# Patient Record
Sex: Female | Born: 1965 | Race: White | Hispanic: No | Smoking: Former smoker
Health system: Southern US, Community
[De-identification: ages and names within clinical notes are randomized; demographics above are authoritative.]

## PROBLEM LIST (undated history)

## (undated) DIAGNOSIS — T7840XA Allergy, unspecified, initial encounter: Secondary | ICD-10-CM

## (undated) DIAGNOSIS — C801 Malignant (primary) neoplasm, unspecified: Secondary | ICD-10-CM

## (undated) DIAGNOSIS — R7303 Prediabetes: Secondary | ICD-10-CM

## (undated) DIAGNOSIS — K4090 Unilateral inguinal hernia, without obstruction or gangrene, not specified as recurrent: Secondary | ICD-10-CM

## (undated) DIAGNOSIS — I10 Essential (primary) hypertension: Secondary | ICD-10-CM

## (undated) DIAGNOSIS — K219 Gastro-esophageal reflux disease without esophagitis: Secondary | ICD-10-CM

## (undated) DIAGNOSIS — D649 Anemia, unspecified: Secondary | ICD-10-CM

## (undated) DIAGNOSIS — N200 Calculus of kidney: Secondary | ICD-10-CM

## (undated) HISTORY — PX: COLONOSCOPY: SHX174

## (undated) HISTORY — DX: Allergy, unspecified, initial encounter: T78.40XA

## (undated) HISTORY — DX: Anemia, unspecified: D64.9

## (undated) HISTORY — PX: INGUINAL HERNIA REPAIR: SUR1180

## (undated) HISTORY — PX: APPENDECTOMY: SHX54

## (undated) HISTORY — PX: WISDOM TOOTH EXTRACTION: SHX21

## (undated) HISTORY — DX: Essential (primary) hypertension: I10

## (undated) HISTORY — DX: Prediabetes: R73.03

## (undated) HISTORY — PX: ABDOMINAL HYSTERECTOMY: SHX81

## (undated) HISTORY — DX: Unilateral inguinal hernia, without obstruction or gangrene, not specified as recurrent: K40.90

## (undated) HISTORY — DX: Calculus of kidney: N20.0

## (undated) HISTORY — PX: COLON SURGERY: SHX602

## (undated) HISTORY — PX: LITHOTRIPSY: SUR834

## (undated) HISTORY — PX: UPPER GASTROINTESTINAL ENDOSCOPY: SHX188

## (undated) HISTORY — PX: CYSTOSCOPY WITH RETROGRADE PYELOGRAM, URETEROSCOPY AND STENT PLACEMENT: SHX5789

---

## 2005-01-08 HISTORY — PX: PARTIAL HYSTERECTOMY: SHX80

## 2009-01-08 DIAGNOSIS — N200 Calculus of kidney: Secondary | ICD-10-CM

## 2009-01-08 HISTORY — DX: Calculus of kidney: N20.0

## 2011-12-31 ENCOUNTER — Emergency Department (HOSPITAL_COMMUNITY)
Admission: EM | Admit: 2011-12-31 | Discharge: 2012-01-01 | Disposition: A | Discharge status: Home / Self Care | Payer: Self-pay | Attending: Emergency Medicine | Admitting: Emergency Medicine

## 2011-12-31 ENCOUNTER — Emergency Department (INDEPENDENT_AMBULATORY_CARE_PROVIDER_SITE_OTHER)
Admission: EM | Admit: 2011-12-31 | Discharge: 2011-12-31 | Disposition: A | Discharge status: Nursing Home (Includes ICF) | Payer: Self-pay | Source: Home / Self Care | Attending: Emergency Medicine | Admitting: Emergency Medicine

## 2011-12-31 ENCOUNTER — Encounter (HOSPITAL_COMMUNITY): Payer: Self-pay | Admitting: Emergency Medicine

## 2011-12-31 ENCOUNTER — Emergency Department (HOSPITAL_COMMUNITY): Payer: Self-pay

## 2011-12-31 DIAGNOSIS — Z87891 Personal history of nicotine dependence: Secondary | ICD-10-CM | POA: Insufficient documentation

## 2011-12-31 DIAGNOSIS — K5792 Diverticulitis of intestine, part unspecified, without perforation or abscess without bleeding: Secondary | ICD-10-CM

## 2011-12-31 DIAGNOSIS — R112 Nausea with vomiting, unspecified: Secondary | ICD-10-CM | POA: Insufficient documentation

## 2011-12-31 DIAGNOSIS — R42 Dizziness and giddiness: Secondary | ICD-10-CM | POA: Insufficient documentation

## 2011-12-31 DIAGNOSIS — Z87442 Personal history of urinary calculi: Secondary | ICD-10-CM | POA: Insufficient documentation

## 2011-12-31 DIAGNOSIS — Z90710 Acquired absence of both cervix and uterus: Secondary | ICD-10-CM | POA: Insufficient documentation

## 2011-12-31 DIAGNOSIS — N390 Urinary tract infection, site not specified: Secondary | ICD-10-CM | POA: Insufficient documentation

## 2011-12-31 DIAGNOSIS — R1900 Intra-abdominal and pelvic swelling, mass and lump, unspecified site: Secondary | ICD-10-CM | POA: Insufficient documentation

## 2011-12-31 DIAGNOSIS — K5732 Diverticulitis of large intestine without perforation or abscess without bleeding: Secondary | ICD-10-CM

## 2011-12-31 DIAGNOSIS — R51 Headache: Secondary | ICD-10-CM | POA: Insufficient documentation

## 2011-12-31 LAB — CBC WITH DIFFERENTIAL/PLATELET
Basophils Absolute: 0 10*3/uL (ref 0.0–0.1)
Basophils Relative: 0 % (ref 0–1)
Eosinophils Absolute: 0.1 10*3/uL (ref 0.0–0.7)
Eosinophils Relative: 1 % (ref 0–5)
HCT: 33.6 % — ABNORMAL LOW (ref 36.0–46.0)
Hemoglobin: 10.4 g/dL — ABNORMAL LOW (ref 12.0–15.0)
Lymphocytes Relative: 20 % (ref 12–46)
Lymphs Abs: 2.9 10*3/uL (ref 0.7–4.0)
MCH: 21.1 pg — ABNORMAL LOW (ref 26.0–34.0)
MCHC: 31 g/dL (ref 30.0–36.0)
MCV: 68.2 fL — ABNORMAL LOW (ref 78.0–100.0)
Monocytes Absolute: 1.6 10*3/uL — ABNORMAL HIGH (ref 0.1–1.0)
Monocytes Relative: 11 % (ref 3–12)
Neutro Abs: 9.8 10*3/uL — ABNORMAL HIGH (ref 1.7–7.7)
Neutrophils Relative %: 68 % (ref 43–77)
Platelets: 343 10*3/uL (ref 150–400)
RBC: 4.93 MIL/uL (ref 3.87–5.11)
RDW: 19 % — ABNORMAL HIGH (ref 11.5–15.5)
WBC: 14.4 10*3/uL — ABNORMAL HIGH (ref 4.0–10.5)

## 2011-12-31 LAB — POCT URINALYSIS DIP (DEVICE)
Bilirubin Urine: NEGATIVE
Glucose, UA: NEGATIVE mg/dL
Ketones, ur: 15 mg/dL — AB
Nitrite: POSITIVE — AB
Protein, ur: 30 mg/dL — AB
Specific Gravity, Urine: 1.02 (ref 1.005–1.030)
Urobilinogen, UA: 1 mg/dL (ref 0.0–1.0)
pH: 5.5 (ref 5.0–8.0)

## 2011-12-31 LAB — BASIC METABOLIC PANEL
BUN: 9 mg/dL (ref 6–23)
CO2: 21 mEq/L (ref 19–32)
Calcium: 9.6 mg/dL (ref 8.4–10.5)
Chloride: 100 mEq/L (ref 96–112)
Creatinine, Ser: 0.82 mg/dL (ref 0.50–1.10)
GFR calc Af Amer: >90 mL/min (ref >90)
GFR calc non Af Amer: 83 mL/min — ABNORMAL LOW (ref >90)
Glucose, Bld: 107 mg/dL — ABNORMAL HIGH (ref 70–99)
Potassium: 3.5 mEq/L (ref 3.5–5.1)
Sodium: 136 mEq/L (ref 135–145)

## 2011-12-31 LAB — LIPASE, BLOOD: Lipase: 20 U/L (ref 11–59)

## 2011-12-31 IMAGING — CT CT ABD-PELV W/O CM
3 of 4 series · 14 of 32 positions shown, 17 images · non-contrast
Comparison: None.

CLINICAL DATA: Left flank pain, history of stones

CT ABDOMEN AND PELVIS WITHOUT CONTRAST
TECHNIQUE: Multidetector CT imaging of the abdomen and pelvis was
performed following the standard protocol without intravenous
contrast.

[Series 2: pre contrast · axial · non-contrast · 0.85mm/px · z∈[-313,-148]mm · 2 of 101 slices shown, 5 images (1 of 2)]
[im 34/101  soft-tissue]
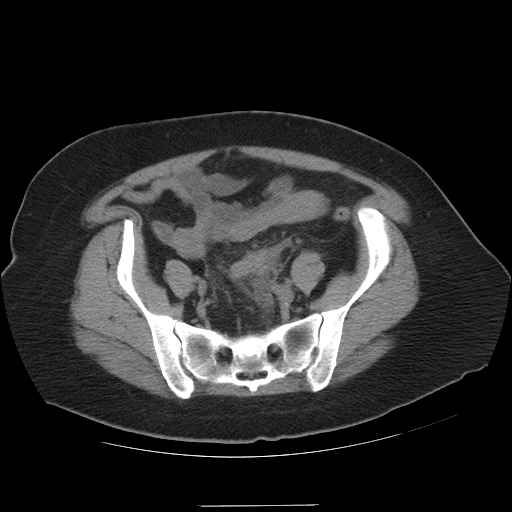
[im 34/101  lung]
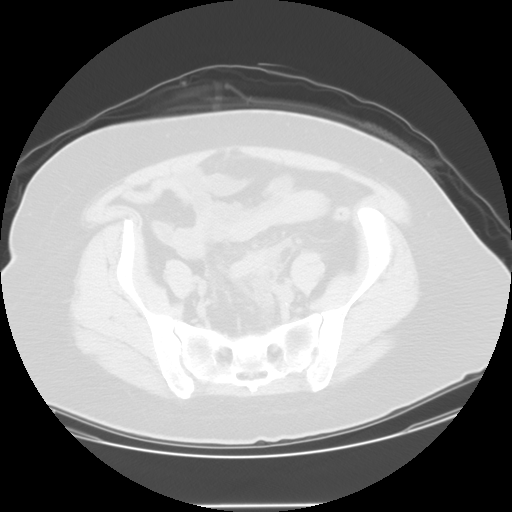
[im 34/101  bone]
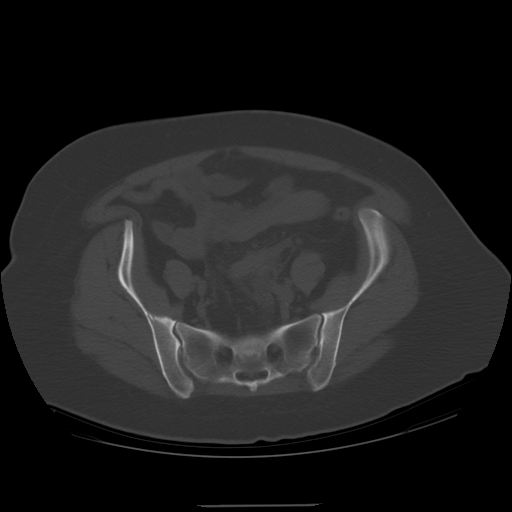
[im 67/101  soft-tissue]
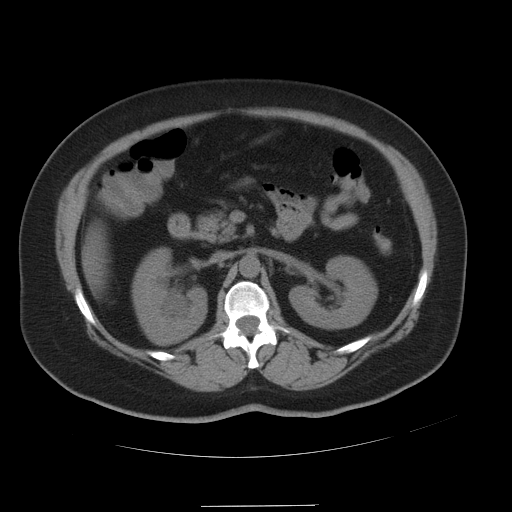
[im 67/101  lung]
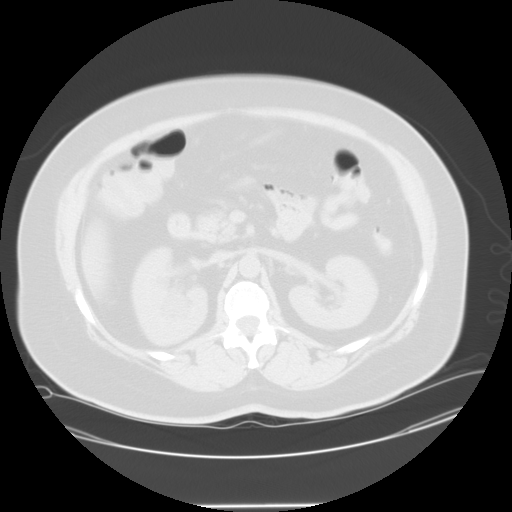

[Series 102: pre contrast · axial · non-contrast · 0.85mm/px · z∈[-450,-82]mm · 8 of 422 slices shown (2 of 2)]
[im 50/422  soft-tissue]
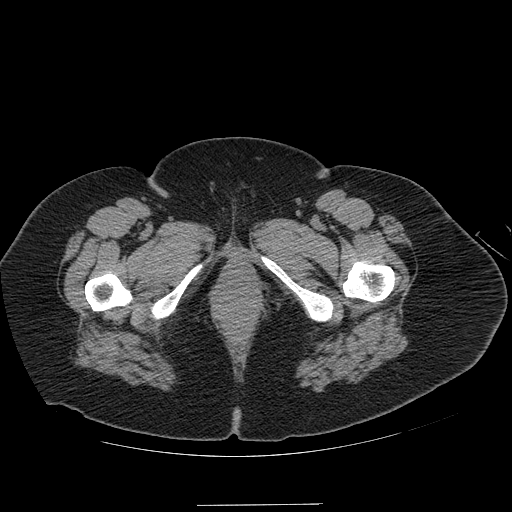
[im 100/422  soft-tissue]
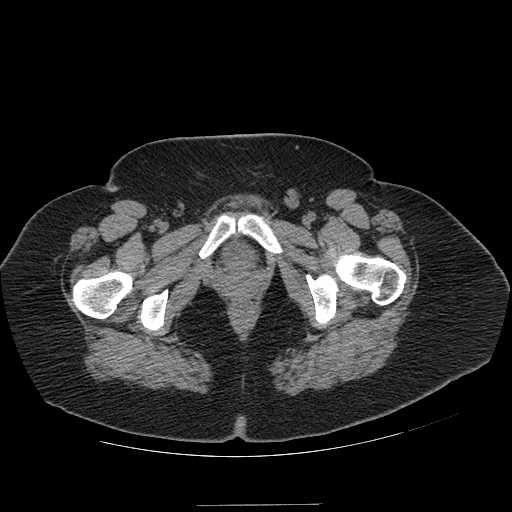
[im 149/422  soft-tissue]
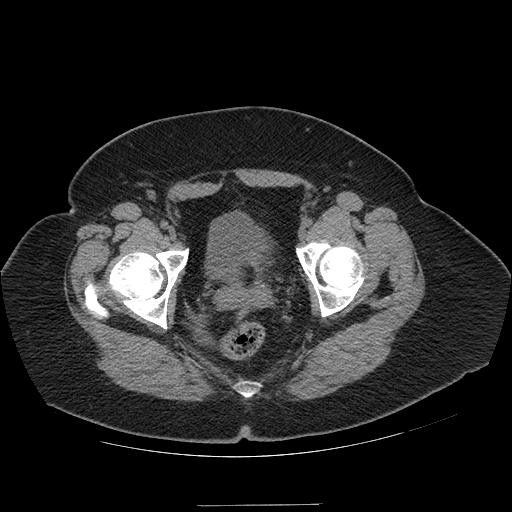
[im 199/422  soft-tissue]
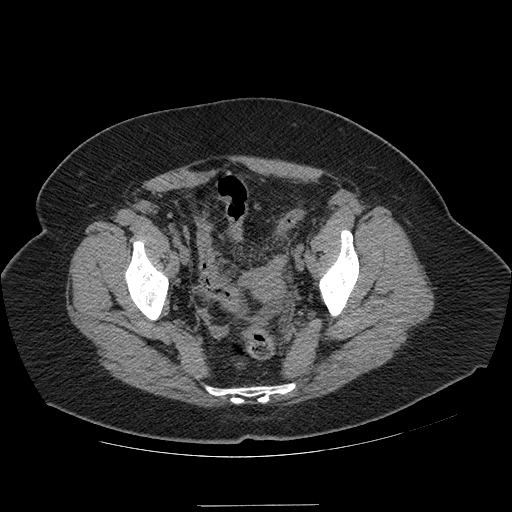
[im 248/422  soft-tissue]
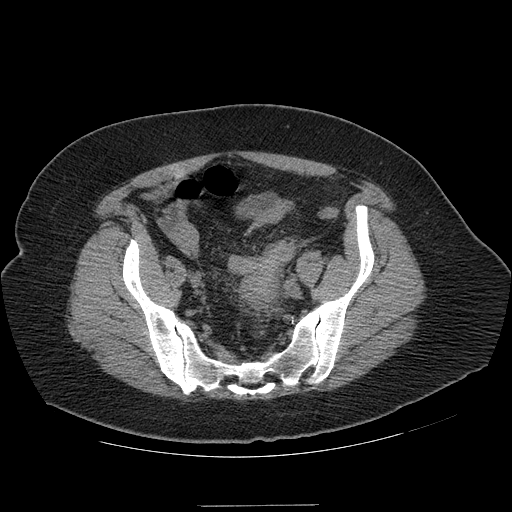
[im 298/422  soft-tissue]
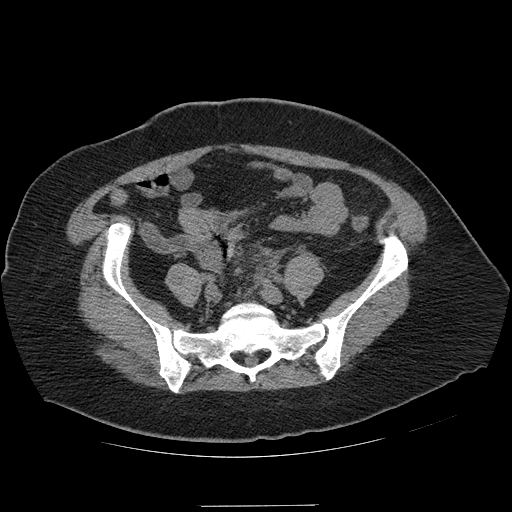
[im 347/422  soft-tissue]
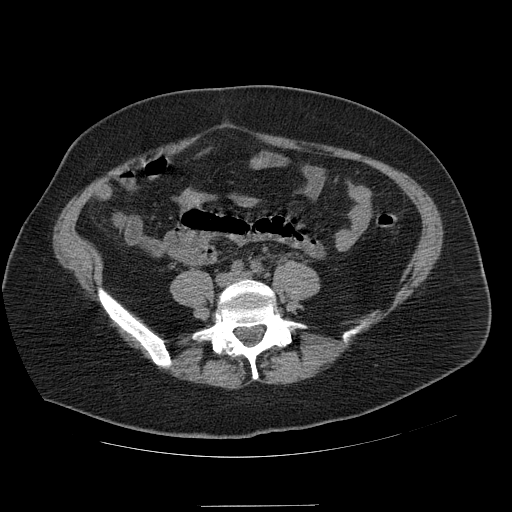
[im 397/422  soft-tissue]
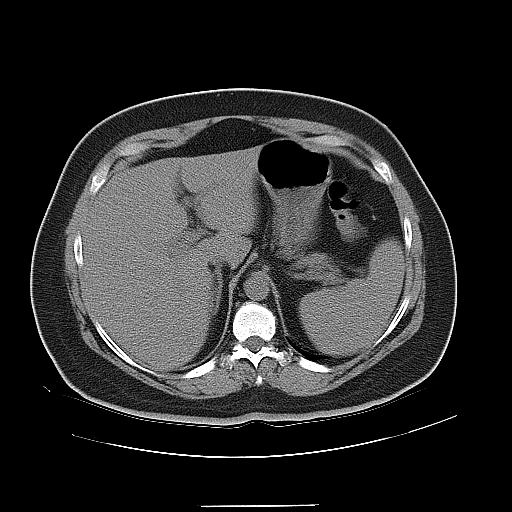

[Series 104: sag · sagittal · 1.01mm/px · 4 of 131 slices shown]
[im 27/131  soft-tissue]
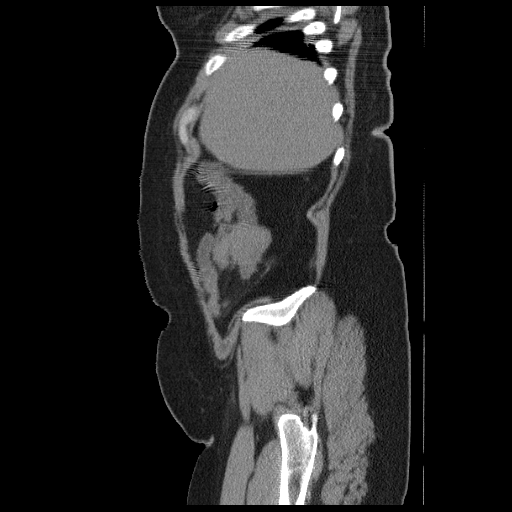
[im 53/131  soft-tissue]
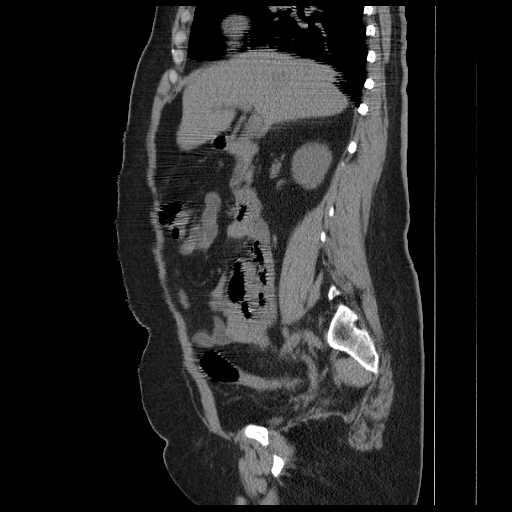
[im 79/131  soft-tissue]
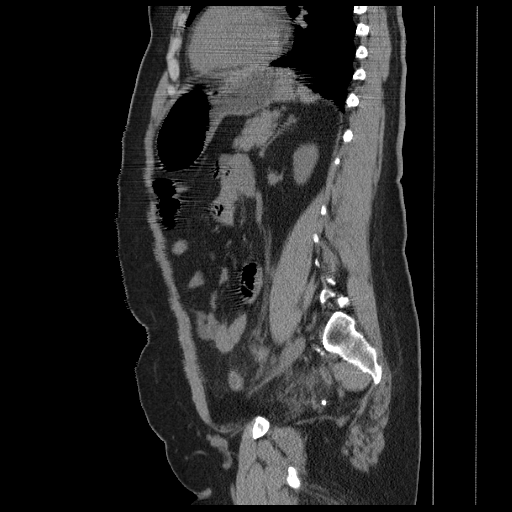
[im 105/131  soft-tissue]
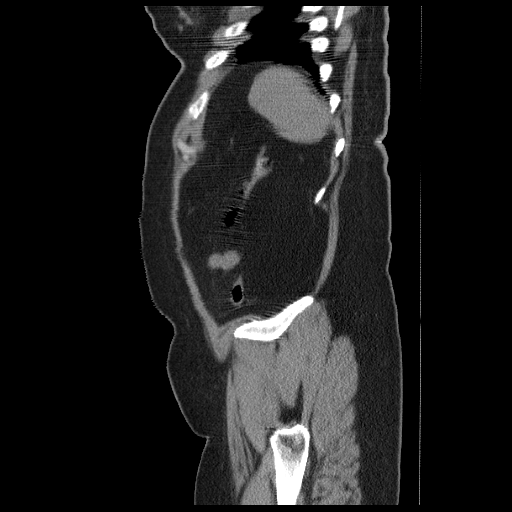

[14 of 32 positions shown; findings below may reference images not displayed]

FINDINGS: Motion degraded images.

Mild linear scarring in the bilateral lower lobes.

Small hiatal hernia.

Unenhanced liver, spleen, pancreas, and adrenal glands are within
normal limits.

Gallbladder is unremarkable.  No intrahepatic or extrahepatic
ductal dilatation.

18 mm staghorn right renal calculus.  Additional nonobstructing
right renal calculi (series 2/image 39).  Left kidney is
unremarkable.  No hydronephrosis.

No evidence of bowel obstruction.  5.3 x 3.9 cm cecal mass (series
2/image 50), worrisome for adenocarcinoma.  Very mild
stranding/secondary inflammatory changes around the appendix
(series 2/image 59).

5.8 x 3.8 x 5.7 cm left pelvic mass, possibly associated with the
left ovary (if it remains in situ). Additional 1.5 x 3.2 cm nodular
soft tissue in the left pelvis is favored to reflect a regional
lymph node (series 2/image 68).

Associated stranding involving adjacent loops of sigmoid colon
(series 2/image 78) with trace pelvic ascites.

No evidence of abdominal aortic aneurysm.

No ureteral or bladder calculi.  Bladder is unremarkable.

Mild degenerative changes of the visualized thoracolumbar spine.
IMPRESSION: 18 mm staghorn right renal calculus and additional nonobstructing
right renal calculi.  No ureteral or bladder calculi.  No
hydronephrosis.

5.3 cm cecal mass, worrisome for adenocarcinoma.

5.8 cm left pelvic mass with suspected adjacent regional
lymphadenopathy.  Differential considerations include primary
ovarian neoplasm (if the left ovary remains in situ) or pelvic
metastasis.

Associated inflammatory changes / stranding adjacent loops of
sigmoid colon.  Trace pelvic ascites.  No drainable fluid
collection or abscess.

These results were called by telephone on [DATE] at [ZE] hours
to Dr. ABDALWAHAD, who verbally acknowledged these results.

## 2011-12-31 MED ORDER — ONDANSETRON 4 MG PO TBDP
4.0000 mg | ORAL_TABLET | Freq: Once | ORAL | Status: AC
  Administered 2011-12-31: 4 mg via ORAL

## 2011-12-31 MED ORDER — ONDANSETRON 4 MG PO TBDP
ORAL_TABLET | ORAL | Status: AC
  Filled 2011-12-31: qty 1

## 2011-12-31 MED ORDER — OXYCODONE-ACETAMINOPHEN 5-325 MG PO TABS
2.0000 | ORAL_TABLET | Freq: Once | ORAL | Status: AC
  Administered 2011-12-31: 2 via ORAL
  Filled 2011-12-31: qty 2

## 2011-12-31 NOTE — ED Notes (Signed)
Pt states that she has had abdominal pain x 3 days. Pain started out in left flank area and moved to lower center abdomen area. And some leg pain. Pt states that she thought it was from eating a lot of fast food over the past few days. Pt has tried laxative and rapid flow for relief thinking that it might be a kidney stone.  Pt has had acute onset of n/v on Saturday. Vomiting has subsided but still has some nausea. Decreased appetite. Denies fever and diarrhea.  Pt has a hx of kidney stones.

## 2011-12-31 NOTE — ED Provider Notes (Signed)
Chief Complaint  Patient presents with  . Abdominal Pain    x 3 days. hx of kidney stones. n/v started out left sided flank pain and moved to center of pelvic area    History of Present Illness:    The patient is a 46 year old female who had a three-day history of lower abdominal pain, worse on the left than right, radiating to the left lower back. The pain comes and goes, feels like pressure, it is rated 8/10 in intensity. It's worse if she eats or urinates. She's felt chilled and had nausea, she vomited 4 times. She's been somewhat constipated. She denies any urinary symptoms. She status post hysterectomy but still has both ovaries. She's also had a C-section. She's never had pain like this before. She does have a history of kidney stones, no definite history of diverticulitis in the past.  Review of Systems:  Other than noted above, the patient denies any of the following symptoms: Constitutional:  No fever, chills, fatigue, weight loss or anorexia. Lungs:  No cough or shortness of breath. Heart:  No chest pain, palpitations, syncope or edema.  No cardiac history. Abdomen:  No nausea, vomiting, hematememesis, melena, diarrhea, or hematochezia. GU:  No dysuria, frequency, urgency, or hematuria. Gyn:  No vaginal discharge, itching, abnormal bleeding, dyspareunia, or pelvic pain.  PMFSH:  Past medical history, family history, social history, meds, and allergies were reviewed along with nurse's notes.  No prior abdominal surgeries, past history of GI problems, STDs or GYN problems.  No history of aspirin or NSAID use.  No excessive alcohol intake.  Physical Exam:   Vital signs:  BP 144/80  Pulse 92  Temp 99.4 F (37.4 C) (Oral)  Resp 20  SpO2 100% Gen:  Alert, oriented, in no distress. Lungs:  Breath sounds clear and equal bilaterally.  No wheezes, rales or rhonchi. Heart:  Regular rhythm.  No gallops or murmers.   Abdomen:  Soft, flat, nondistended. There is pain to palpation both lower  quadrants, left worse than right with guarding and rebound in the left lower quadrant but not in the right. Bowel sounds were diminished. There was no organomegaly or mass. Skin:  Clear, warm and dry.  No rash.  Labs:   Results for orders placed during the hospital encounter of 12/31/11  POCT URINALYSIS DIP (DEVICE)      Component Value Range   Glucose, UA NEGATIVE  NEGATIVE mg/dL   Bilirubin Urine NEGATIVE  NEGATIVE   Ketones, ur 15 (*) NEGATIVE mg/dL   Specific Gravity, Urine 1.020  1.005 - 1.030   Hgb urine dipstick SMALL (*) NEGATIVE   pH 5.5  5.0 - 8.0   Protein, ur 30 (*) NEGATIVE mg/dL   Urobilinogen, UA 1.0  0.0 - 1.0 mg/dL   Nitrite POSITIVE (*) NEGATIVE   Leukocytes, UA LARGE (*) NEGATIVE     Assessment:  The encounter diagnosis was Diverticulitis.   Differential diagnosis includes appendicitis, diverticulitis, urinary tract infection, or ovarian cyst.  Plan:   1.  The following meds were prescribed:   New Prescriptions   No medications on file   2.  The patient was transferred to the emergency department via shuttle.   Reuben Likes, MD 12/31/11 [phone]

## 2011-12-31 NOTE — ED Provider Notes (Signed)
History     CSN: [id number]  Arrival date & time 12/31/11  1747   First MD Initiated Contact with Patient 12/31/11 2147      Chief Complaint  Patient presents with  . Abdominal Pain    (Consider location/radiation/quality/duration/timing/severity/associated sxs/prior treatment) HPI Comments: Patient states she started on Friday, which was 4, days ago, with left flank pain.  That has radiated to her abdomen.  Now.  She has abdominal pressure, nausea, or vomiting.  She's been unable to eat anything for the past 36 hours.  She went to urgent care, rigidity.  She has a urinary tract, infection, but do to her history of kidney stones.  She has been referred to the emergency department for further evaluation and CT scan  Patient is a 46 y.o. female presenting with abdominal pain. The history is provided by the patient.  Abdominal Pain The primary symptoms of the illness include abdominal pain, nausea and vomiting. The primary symptoms of the illness do not include fever, shortness of breath, diarrhea or dysuria. The current episode started more than 2 days ago.  Symptoms associated with the illness do not include chills, hematuria or frequency.    History reviewed. No pertinent past medical history.  Past Surgical History  Procedure Date  . Abdominal hysterectomy   . Cesarean section     History reviewed. No pertinent family history.  History  Substance Use Topics  . Smoking status: Former Smoker    Types: Cigarettes  . Smokeless tobacco: Not on file  . Alcohol Use: Yes     Comment: occasional    OB History    Grav Para Term Preterm Abortions TAB SAB Ect Mult Living                  Review of Systems  Constitutional: Negative for fever and chills.  Respiratory: Negative for shortness of breath.   Gastrointestinal: Positive for nausea, vomiting and abdominal pain. Negative for diarrhea.  Genitourinary: Positive for flank pain. Negative for dysuria, frequency and  hematuria.  Skin: Negative for rash and wound.  Neurological: Positive for dizziness and headaches.    Allergies  Penicillins  Home Medications   Current Outpatient Rx  Name  Route  Sig  Dispense  Refill  . CIPROFLOXACIN HCL 500 MG PO TABS   Oral   Take 1 tablet (500 mg total) by mouth 2 (two) times daily.   13 tablet   0   . HYDROCODONE-ACETAMINOPHEN 5-325 MG PO TABS   Oral   Take 1 tablet by mouth every 6 (six) hours as needed for pain.   30 tablet   0     BP 125/63  Pulse 76  Temp 98.7 F (37.1 C) (Oral)  Resp 18  SpO2 100%  Physical Exam  Constitutional: She is oriented to person, place, and time. She appears well-developed and well-nourished. No distress.       Obese  HENT:  Head: Normocephalic and atraumatic.  Eyes: Pupils are equal, round, and reactive to light.  Neck: Normal range of motion.  Cardiovascular: Normal rate.   Pulmonary/Chest: Effort normal.  Abdominal: She exhibits no distension. There is tenderness.    Musculoskeletal: Normal range of motion. She exhibits no edema and no tenderness.  Neurological: She is alert and oriented to person, place, and time.  Skin: No rash noted. No erythema.    ED Course  Procedures (including critical care time)  Labs Reviewed  CBC WITH DIFFERENTIAL - Abnormal; Notable for the  following:    WBC 14.4 (*)     Hemoglobin 10.4 (*)     HCT 33.6 (*)     MCV 68.2 (*)     MCH 21.1 (*)     RDW 19.0 (*)     Neutro Abs 9.8 (*)     Monocytes Absolute 1.6 (*)     All other components within normal limits  BASIC METABOLIC PANEL - Abnormal; Notable for the following:    Glucose, Bld 107 (*)     GFR calc non Af Amer 83 (*)     All other components within normal limits  LIPASE, BLOOD   Ct Abdomen Pelvis Wo Contrast  12/31/2011  *RADIOLOGY REPORT*  Clinical Data: Left flank pain, history of stones  CT ABDOMEN AND PELVIS WITHOUT CONTRAST  Technique:  Multidetector CT imaging of the abdomen and pelvis was performed  following the standard protocol without intravenous contrast.  Comparison: None.  Findings: Motion degraded images.  Mild linear scarring in the bilateral lower lobes.  Small hiatal hernia.  Unenhanced liver, spleen, pancreas, and adrenal glands are within normal limits.  Gallbladder is unremarkable.  No intrahepatic or extrahepatic ductal dilatation.  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  Additional nonobstructing right renal calculi (series 2/image 39).  Left kidney is unremarkable.  No hydronephrosis.  No evidence of bowel obstruction.  5.3 x 3.9 cm cecal mass (series 2/image 50), worrisome for adenocarcinoma.  Very mild stranding/secondary inflammatory changes around the appendix (series 2/image 59).  5.8 x 3.8 x 5.7 cm left pelvic mass, possibly associated with the left ovary (if it remains in situ). Additional 1.5 x 3.2 cm nodular soft tissue in the left pelvis is favored to reflect a regional lymph node (series 2/image 68).  Associated stranding involving adjacent loops of sigmoid colon (series 2/image 78) with trace pelvic ascites.  No evidence of abdominal aortic aneurysm.  No ureteral or bladder calculi.  Bladder is unremarkable.  Mild degenerative changes of the visualized thoracolumbar spine.  IMPRESSION: 18 mm staghorn right renal calculus and additional nonobstructing right renal calculi.  No ureteral or bladder calculi.  No hydronephrosis.  5.3 cm cecal mass, worrisome for adenocarcinoma.  5.8 cm left pelvic mass with suspected adjacent regional lymphadenopathy.  Differential considerations include primary ovarian neoplasm (if the left ovary remains in situ) or pelvic metastasis.  Associated inflammatory changes / stranding adjacent loops of sigmoid colon.  Trace pelvic ascites.  No drainable fluid collection or abscess.  These results were called by telephone on 12/31/2011 at 2325 hours to Dr. Wayland Salinas, who verbally acknowledged these results.   Original Report Authenticated By: Charline Bills,  M.D.      1. Urinary tract infection   2. Abdominal mass       MDM  Urine shows positive nitrates, positive leukocytes, negative for blood CT scan results were called to Dr. Wayland Salinas discuss this with the patient and family.  She will be sent home with Cipro for her urinary tract is infection Vicodin for pain control.  She has been referred to [facility] [place] [facility] Dr. Fonnie Jarvis did speak with Dr. Magnus Ivan to make, sure that she can be seen tomorrow.  She also has an OB/GYN physician in [place], that she will contact tomorrow, as well       Arman Filter, NP 01/01/12 [phone]

## 2011-12-31 NOTE — ED Notes (Signed)
Pt c/o lower abd pain; pt sent from [facility] for further eval; pt sts hx of kidney stone

## 2012-01-01 MED ORDER — CIPROFLOXACIN HCL 500 MG PO TABS: 500.0000 mg | ORAL_TABLET | Freq: Two times a day (BID) | ORAL | Status: DC

## 2012-01-01 MED ORDER — HYDROCODONE-ACETAMINOPHEN 5-325 MG PO TABS
1.0000 | ORAL_TABLET | Freq: Once | ORAL | Status: AC
  Administered 2012-01-01: 1 via ORAL
  Filled 2012-01-01: qty 1

## 2012-01-01 MED ORDER — HYDROCODONE-ACETAMINOPHEN 5-325 MG PO TABS: 1.0000 | ORAL_TABLET | Freq: Four times a day (QID) | ORAL | Status: DC | PRN

## 2012-01-01 MED ORDER — CIPROFLOXACIN HCL 500 MG PO TABS
500.0000 mg | ORAL_TABLET | Freq: Once | ORAL | Status: AC
  Administered 2012-01-01: 500 mg via ORAL
  Filled 2012-01-01: qty 1

## 2012-01-01 NOTE — ED Provider Notes (Signed)
Medical screening examination/treatment/procedure(s) were conducted as a shared visit with non-physician practitioner(s) and myself.  I personally evaluated the patient during the encounter  Chronic left pelvic pain since hysterectomy, CT scan worrisome for 2 masses possible CA, d/w Pt/family. D/w [facility], Pt to f/u with Gyn/GI/and [facility].  Hurman Horn, MD 01/03/12 1255

## 2012-01-01 NOTE — ED Notes (Signed)
Discharge inst reviewed with patient.  Voiced understanding.  Requested the name of a GYN in the area.  Tomasa Blase, NP in to talk and give them a name.

## 2012-01-01 NOTE — ED Provider Notes (Signed)
Medical screening examination/treatment/procedure(s) were performed by non-physician practitioner and as supervising physician I was immediately available for consultation/collaboration.   Carleene Cooper III, MD 01/01/12 [phone]

## 2012-01-15 ENCOUNTER — Encounter: Payer: Self-pay | Admitting: Internal Medicine

## 2012-01-15 ENCOUNTER — Ambulatory Visit (INDEPENDENT_AMBULATORY_CARE_PROVIDER_SITE_OTHER): Payer: Self-pay | Admitting: Surgery

## 2012-01-15 ENCOUNTER — Encounter (INDEPENDENT_AMBULATORY_CARE_PROVIDER_SITE_OTHER): Payer: Self-pay | Admitting: Surgery

## 2012-01-15 VITALS — BP 122/68 | HR 70 | Temp 98.0°F | Resp 18 | Ht 66.0 in | Wt 217.6 lb

## 2012-01-15 DIAGNOSIS — K6389 Other specified diseases of intestine: Secondary | ICD-10-CM | POA: Insufficient documentation

## 2012-01-15 DIAGNOSIS — R1900 Intra-abdominal and pelvic swelling, mass and lump, unspecified site: Secondary | ICD-10-CM | POA: Insufficient documentation

## 2012-01-15 NOTE — Progress Notes (Signed)
Patient ID: Tonya Singh, female   DOB: May 21, 1965, 47 y.o.   MRN: [id number]  Chief Complaint  Patient presents with  . Abdominal Pain    HPI Tonya Singh is a 47 y.o. female.   HPI This is a pleasant 47 year old female who presented to the emergency department on December 23 with sharp left lower quadrant abdominal pain. She had a CAT scan to rule out a kidney stone. This demonstrated a possible 5 cm cecal mass and a left pelvic mass. She had a urinary tract infection as well. The CAT scan also showed some inflammation in the left lower quadrant. Differential included both the colon and ovarian cancer versus colon cancer with metastatic disease. She was referred here for followup. She has had a previous partial hysterectomy. She reports she had been doing well until this onset of pain. Ever since her hysterectomy, she does have mild left lower quadrant abdominal wall pain. She has recently been constipated but this is improving. She denies nausea, vomiting, abdominal distention, blood per rectum, melena, or weight loss. History reviewed. No pertinent past medical history.  Past Surgical History  Procedure Date  . Abdominal hysterectomy   . Cesarean section     History reviewed. No pertinent family history.  Social History History  Substance Use Topics  . Smoking status: Former Smoker    Types: Cigarettes  . Smokeless tobacco: Not on file  . Alcohol Use: Yes     Comment: occasional    Allergies  Allergen Reactions  . Penicillins Hives and Rash    Current Outpatient Prescriptions  Medication Sig Dispense Refill  . HYDROcodone-acetaminophen (NORCO/VICODIN) 5-325 MG per tablet Take 1 tablet by mouth every 6 (six) hours as needed for pain.  30 tablet  0    Review of Systems Review of Systems  Constitutional: Negative for fever, chills and unexpected weight change.  HENT: Negative for hearing loss, congestion, sore throat, trouble swallowing and voice change.   Eyes:  Negative for visual disturbance.  Respiratory: Negative for cough and wheezing.   Cardiovascular: Negative for chest pain, palpitations and leg swelling.  Gastrointestinal: Positive for abdominal pain and constipation. Negative for nausea, vomiting, diarrhea, blood in stool, abdominal distention and anal bleeding.  Genitourinary: Negative for hematuria, vaginal bleeding and difficulty urinating.  Musculoskeletal: Negative for arthralgias.  Skin: Negative for rash and wound.  Neurological: Negative for seizures, syncope and headaches.  Hematological: Negative for adenopathy. Does not bruise/bleed easily.  Psychiatric/Behavioral: Negative for confusion.    Blood pressure 122/68, pulse 70, temperature 98 F (36.7 C), temperature source Temporal, resp. rate 18, height 5\' 6"  (1.676 m), weight 217 lb 9.6 oz (98.703 kg).  Physical Exam Physical Exam  Constitutional: She is oriented to person, place, and time. She appears well-developed and well-nourished. No distress.  HENT:  Head: Normocephalic and atraumatic.  Right Ear: External ear normal.  Left Ear: External ear normal.  Nose: Nose normal.  Mouth/Throat: Oropharynx is clear and moist. No oropharyngeal exudate.  Eyes: Conjunctivae normal are normal. Pupils are equal, round, and reactive to light. Right eye exhibits no discharge. Left eye exhibits no discharge. No scleral icterus.  Neck: Normal range of motion. Neck supple. No tracheal deviation present. No thyromegaly present.  Cardiovascular: Normal rate, regular rhythm, normal heart sounds and intact distal pulses.   No murmur heard. Pulmonary/Chest: Effort normal and breath sounds normal. No respiratory distress. She has no wheezes.  Abdominal: Soft. Bowel sounds are normal. She exhibits no distension and no mass. There  is no tenderness. There is no rebound and no guarding.  Musculoskeletal: Normal range of motion. She exhibits no edema and no tenderness.  Neurological: She is alert  and oriented to person, place, and time.  Skin: Skin is warm and dry. No rash noted. She is not diaphoretic. No erythema.  Psychiatric: Her behavior is normal. Judgment normal.    Data Reviewed I have reviewed the patient's CAT scan as well as her laboratory data  Assessment    Cecal mass and left pelvic mass of uncertain etiology    Plan    I discussed this with the patient and her husband in detail. Currently she is asymptomatic and her abdominal exam is benign. I believe she needs a colonoscopy to evaluate the cecum and rest of the colon. I'm going to also evaluate tumor markers. I will check a CEA and CA 125. They are uninsured and multiply until the first week in February to see the gastroenterologist. This may change depending on her markers Or symptoms. I will see her back after she is seen the gastroenterologist unless her symptoms recur. Again, I explained to them the likelihood that this could be cancer. They understand, but they are limited by their financial resources.       [name],[name] 01/15/2012, 10:46 AM

## 2012-01-16 LAB — CA 125: CA 125: 14.5 U/mL (ref 0.0–30.2)

## 2012-01-16 LAB — CEA: CEA: 2.7 ng/mL (ref 0.0–5.0)

## 2012-02-11 ENCOUNTER — Telehealth: Payer: Self-pay | Admitting: Internal Medicine

## 2012-02-11 ENCOUNTER — Encounter: Payer: Self-pay | Admitting: Internal Medicine

## 2012-02-11 ENCOUNTER — Ambulatory Visit (INDEPENDENT_AMBULATORY_CARE_PROVIDER_SITE_OTHER): Payer: BC Managed Care – PPO | Admitting: Internal Medicine

## 2012-02-11 VITALS — BP 126/100 | HR 72 | Ht 65.0 in | Wt 221.2 lb

## 2012-02-11 DIAGNOSIS — C189 Malignant neoplasm of colon, unspecified: Secondary | ICD-10-CM | POA: Insufficient documentation

## 2012-02-11 DIAGNOSIS — R933 Abnormal findings on diagnostic imaging of other parts of digestive tract: Secondary | ICD-10-CM

## 2012-02-11 DIAGNOSIS — R599 Enlarged lymph nodes, unspecified: Secondary | ICD-10-CM

## 2012-02-11 DIAGNOSIS — R590 Localized enlarged lymph nodes: Secondary | ICD-10-CM | POA: Insufficient documentation

## 2012-02-11 DIAGNOSIS — R1310 Dysphagia, unspecified: Secondary | ICD-10-CM

## 2012-02-11 DIAGNOSIS — N200 Calculus of kidney: Secondary | ICD-10-CM | POA: Insufficient documentation

## 2012-02-11 DIAGNOSIS — R935 Abnormal findings on diagnostic imaging of other abdominal regions, including retroperitoneum: Secondary | ICD-10-CM | POA: Insufficient documentation

## 2012-02-11 MED ORDER — NA SULFATE-K SULFATE-MG SULF 17.5-3.13-1.6 GM/177ML PO SOLN: ORAL | Status: DC

## 2012-02-11 NOTE — Progress Notes (Signed)
Subjective:  Referred by:  Shelly Rubenstein, MD [address] [place], [place] [address]   Patient ID: Tonya Singh, female    DOB: Dec 06, 1965, 47 y.o.   MRN: [id number]  HPI This lady presents with her husband due to recent CT findings of a cecal mass, probable associated lymph node and a left pelvic/ovarian mass seen on CT w/o contrast. She presented to ED in late December 2013 with LLQ and LUQ pain, ? If she was having a kidney stone. Was also constipated. CT findings noted, hydrocodone/APAP and MiraLax prn have provided relief. She was prescribed cipro - UTI and ? Of diverticulitis - also. She has had some left axillary swelling, node(s) that improved on the cipro. Has seen Dr. Magnus Ivan and referred - has had normal CA-125 and CEA. Overall better.  She also has had years of intermittent impact dysphagia to solid food, and some heartburn problems she has just dealt with by regurgitating.  GI ROS is otherwise negative.  Allergies  Allergen Reactions  . Penicillins Hives and Rash   Outpatient Prescriptions Prior to Visit  Medication Sig Dispense Refill  . HYDROcodone-acetaminophen (NORCO/VICODIN) 5-325 MG per tablet Take 1 tablet by mouth every 6 (six) hours as needed for pain.  30 tablet  0   Last reviewed on 02/11/2012  9:37 AM by Iva Boop, MD Past Medical History  Diagnosis Date  . Kidney stones 2011  . Fibroid uterus    Past Surgical History  Procedure Date  . Cesarean section   . Partial hysterectomy 2007   History   Social History  . Marital Status: Married    Spouse Name: N/A    Number of Children: 1  . Years of Education: Criminal justice degree   Occupational History  . unemployed    Social History Main Topics  . Smoking status: Former Smoker    Types: Cigarettes  . Smokeless tobacco: Never Used  . Alcohol Use: Yes     Comment: occasional  . Drug Use: No  . 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 Colon polyps Father   . Breast cancer Maternal Grandmother      Review of Systems Recent UTI - also has a Staghorn calculus in left kidney and another stone. + itching, urinary frequency, fatigue All ROS negative except HPI    Objective:   Physical Exam General:  Well-developed, well-nourished and in no acute distress - overweight Eyes:  anicteric. ENT:   Mouth and posterior pharynx free of lesions.  Neck:   supple w/o thyromegaly or mass.  Lungs: Clear to auscultation bilaterally. Heart:  S1S2, no rubs, murmurs, gallops. Abdomen:  soft, mildly tender RLQ to deep palpation, no hepatosplenomegaly, hernia, or mass and BS+.  Rectal: deferred Lymph:  no cervical or supraclavicular adenopathy.no inguinal adenoapthy small mobile and slightly tender left axillary node Extremities:   no edema Skin   no rash. Neuro:  A&O x 3.  Psych:  appropriate mood and  Affect.   Data Reviewed: Images of CT from December - reviewed with patient and husband  ED note, [facility] note, labs in EMR   CT abd/pelvis no contrast 12/31/11  18 mm staghorn right renal calculus and additional nonobstructing  right renal calculi. No ureteral or bladder calculi.  No  hydronephrosis.  5.3 cm cecal mass, worrisome for adenocarcinoma.  5.8 cm left pelvic mass with suspected adjacent regional  lymphadenopathy. Differential considerations include primary  ovarian neoplasm (if the left ovary remains in situ) or pelvic  metastasis.  Associated inflammatory changes / stranding adjacent loops of  sigmoid colon. Trace pelvic ascites. No drainable fluid  collection or abscess.  These results were called by telephone on 12/31/2011 at 2325 hours  to Dr. Wayland Salinas, who verbally acknowledged these results.     Assessment & Plan:   1. Abnormal computed tomography of cecum - mass    2. Abnormal CT scan, pelvis - left mass   3. Dysphagia   4. Left axillary lymphadenopathy (? Hidraadenitis) - improved with cipro 5. Right nephrolithiasis w/ staghorn calculus  1. Colonoscopy to investigate #1 - The risks and benefits as well as alternatives of endoscopic procedure(s) have been discussed and reviewed. All questions answered. The patient agrees to proceed. 2. EGD, dilation to evaluate and treat #3, The risks and benefits as well as alternatives of endoscopic procedure(s) have been discussed and reviewed. All questions answered. The patient agrees to proceed. 3. Further plans pending these results - may need MR, CT w/ contrast 4. Though not a primary issue now the staghorn calculus will need to be addressed by urology I suspect  ZO:XWRU,EAV, NP and Abigail Miyamoto, MD

## 2012-02-11 NOTE — Telephone Encounter (Signed)
Spoke to [place] at [facility] in [place] and clarified rx for suprep.

## 2012-02-11 NOTE — Patient Instructions (Addendum)
You have been scheduled for an endoscopy and colonoscopy with propofol. Please follow the written instructions given to you at your visit today. Please pick up your prep at the pharmacy within the next 1-3 days. If you use inhalers (even only as needed) or a CPAP machine, please bring them with you on the day of your procedure.  Thank you for choosing me and [facility].  Carl E. Gessner, M.D., FACG  

## 2012-02-12 ENCOUNTER — Ambulatory Visit (AMBULATORY_SURGERY_CENTER): Payer: BC Managed Care – PPO | Admitting: Internal Medicine

## 2012-02-12 ENCOUNTER — Encounter: Payer: Self-pay | Admitting: Internal Medicine

## 2012-02-12 VITALS — BP 129/71 | HR 61 | Temp 98.0°F | Resp 13 | Ht 65.0 in | Wt 221.0 lb

## 2012-02-12 DIAGNOSIS — K6389 Other specified diseases of intestine: Secondary | ICD-10-CM

## 2012-02-12 DIAGNOSIS — K209 Esophagitis, unspecified without bleeding: Secondary | ICD-10-CM

## 2012-02-12 DIAGNOSIS — R933 Abnormal findings on diagnostic imaging of other parts of digestive tract: Secondary | ICD-10-CM

## 2012-02-12 DIAGNOSIS — R1310 Dysphagia, unspecified: Secondary | ICD-10-CM

## 2012-02-12 DIAGNOSIS — D126 Benign neoplasm of colon, unspecified: Secondary | ICD-10-CM

## 2012-02-12 MED ORDER — SODIUM CHLORIDE 0.9 % IV SOLN: 500.0000 mL | INTRAVENOUS | Status: DC

## 2012-02-12 MED ORDER — PANTOPRAZOLE SODIUM 40 MG PO TBEC: 40.0000 mg | DELAYED_RELEASE_TABLET | Freq: Every day | ORAL | Status: DC

## 2012-02-12 NOTE — Op Note (Signed)
[facility] [address] [place], [address]   COLONOSCOPY PROCEDURE REPORT  PATIENT: Tonya Singh, Tonya Singh  MR#: [id number] BIRTHDATE: [birth year] , 46  yrs. old GENDER: Female ENDOSCOPIST: Iva Boop, MD, [facility] REFERRED [id number] Magnus Ivan, M.D. PROCEDURE DATE:  02/12/2012 PROCEDURE:   Colonoscopy with biopsy and snare polypectomy ASA CLASS:   Class II INDICATIONS:an abnormal CT. MEDICATIONS: propofol (Diprivan) 200mg  IV, MAC sedation, administered by CRNA, These medications were titrated to patient response per physician's verbal order, and There was residual sedation effect present from prior procedure.  DESCRIPTION OF PROCEDURE:   After the risks benefits and alternatives of the procedure were thoroughly explained, informed consent was obtained.  A digital rectal exam revealed no abnormalities of the rectum.   The LB CF-H180AL [id number]  endoscope was introduced through the anus and advanced to the cecum, which was identified by both the appendix and ileocecal valve. No adverse events experienced.   The quality of the prep was Suprep excellent The instrument was then slowly withdrawn as the colon was fully examined.      COLON FINDINGS: A near circumferential polypoid shaped and firm mass was found at the cecum.  Multiple biopsies were performed using cold forceps.   A polypoid shaped pedunculated polyp measuring 1.5 cm in size was found in the sigmoid colon.  A polypectomy was performed using snare cautery.  The resection was complete and the polyp tissue was completely retrieved.   The colon mucosa was otherwise normal.  Retroflexed views revealed no abnormalities. The time to cecum=1 minutes 30 seconds.  Withdrawal time=9 minutes 23 seconds.  The scope was withdrawn and the procedure completed. COMPLICATIONS: There were no complications.  ENDOSCOPIC IMPRESSION: 1.   Near circumferential mass were found at the cecum; multiple biopsies were performed  using cold forceps - suspect carcinoma 2.   Pedunculated polyp measuring 1.5 cm in size was found in the sigmoid colon; polypectomy was performed using snare cautery 3.   The colon mucosa was otherwise normal  RECOMMENDATIONS: Hold aspirin, aspirin products, and anti-inflammatory medication for 2 weeks. Will call results and plans   eSigned:  Iva Boop, MD, [facility] 02/12/2012 3:03 PM   cc: Abigail Miyamoto, MD, Gaye Alken, NP and The Patient

## 2012-02-12 NOTE — Patient Instructions (Addendum)
There was inflammation in the esophagus - "esophagitis" - likely from acid reflux. I took biopsies and then dilated the esophagus to help you swallow better. Please follow the special diet instructions.  I have also started a medicine called pantoprazole to help you feel better. Please pick up at your pharmacy soon.  The colonoscopy showed a mass in the beginning of the colon that is concerning for cancer. Biopsies were taken and I should know by Thursday. I also removed a polyp that looks benign.  Once the tissue type is known will figure out next steps to help you.  Thank you for choosing me and [facility].  Iva Boop, MD, [facility]    Please hold aspirin, aspirin products, and any anti-inflammatory medications for two week until Feb. 16, 2014.  You may resume your other current medications today.  Please call if any questions.   YOU HAD AN ENDOSCOPIC PROCEDURE TODAY AT THE [facility]: Refer to the procedure report that was given to you for any specific questions about what was found during the examination.  If the procedure report does not answer your questions, please call your gastroenterologist to clarify.  If you requested that your care partner not be given the details of your procedure findings, then the procedure report has been included in a sealed envelope for you to review at your convenience later.  YOU SHOULD EXPECT: Some feelings of bloating in the abdomen. Passage of more gas than usual.  Walking can help get rid of the air that was put into your GI tract during the procedure and reduce the bloating. If you had a lower endoscopy (such as a colonoscopy or flexible sigmoidoscopy) you may notice spotting of blood in your stool or on the toilet paper. If you underwent a bowel prep for your procedure, then you may not have a normal bowel movement for a few days.  DIET:   Drink plenty of fluids but you should avoid alcoholic beverages for 24 hours.   Please follow the dilatation diet the rest of the day.    ACTIVITY: Your care partner should take you home directly after the procedure.  You should plan to take it easy, moving slowly for the rest of the day.  You can resume normal activity the day after the procedure however you should NOT DRIVE or use heavy machinery for 24 hours (because of the sedation medicines used during the test).    SYMPTOMS TO REPORT IMMEDIATELY: A gastroenterologist can be reached at any hour.  During normal business hours, 8:30 AM to 5:00 PM Monday through Friday, call [phone].  After hours and on weekends, please call the GI answering service at [phone] who will take a message and have the physician on call contact you.   Following lower endoscopy (colonoscopy or flexible sigmoidoscopy):  Excessive amounts of blood in the stool  Significant tenderness or worsening of abdominal pains  Swelling of the abdomen that is new, acute  Fever of 100F or higher  Following upper endoscopy (EGD)  Vomiting of blood or coffee ground material  New chest pain or pain under the shoulder blades  Painful or persistently difficult swallowing  New shortness of breath  Fever of 100F or higher  Black, tarry-looking stools  FOLLOW UP: If any biopsies were taken you will be contacted by phone or by letter within the next 1-3 weeks.  Call your gastroenterologist if you have not heard about the biopsies in 3 weeks.  Our  staff will call the home number listed on your records the next business day following your procedure to check on you and address any questions or concerns that you may have at that time regarding the information given to you following your procedure. This is a courtesy call and so if there is no answer at the home number and we have not heard from you through the emergency physician on call, we will assume that you have returned to your regular daily activities without  incident.  SIGNATURES/CONFIDENTIALITY: You and/or your care partner have signed paperwork which will be entered into your electronic medical record.  These signatures attest to the fact that that the information above on your After Visit Summary has been reviewed and is understood.  Full responsibility of the confidentiality of this discharge information lies with you and/or your care-partner.

## 2012-02-12 NOTE — Progress Notes (Signed)
Called to room to assist during endoscopic procedure.  Patient ID and intended procedure confirmed with present staff. Received instructions for my participation in the procedure from the performing physician.  

## 2012-02-12 NOTE — Op Note (Signed)
[facility] [address] [place], [address]   ENDOSCOPY PROCEDURE REPORT  PATIENT: Tonya, Singh  MR#: [id number] BIRTHDATE: 08-25-1965 , 46  yrs. old GENDER: Female ENDOSCOPIST: Iva Boop, MD, [facility] REFERRED BY:  Abigail Miyamoto, M.D. PROCEDURE DATE:  02/12/2012 PROCEDURE:  EGD w/ biopsy and Maloney dilation of esophagus ASA CLASS:     Class II INDICATIONS:  Dysphagia. MEDICATIONS: propofol (Diprivan) 150mg  IV, MAC sedation, administered by CRNA, These medications were titrated to patient response per physician's verbal order, and Robinul 0.2 mg IV TOPICAL ANESTHETIC: Cetacaine Spray  DESCRIPTION OF PROCEDURE: After the risks benefits and alternatives of the procedure were thoroughly explained, informed consent was obtained.  The LB GIF-H180 [id number] endoscope was introduced through the mouth and advanced to the second portion of the duodenum. Without limitations.  The instrument was slowly withdrawn as the mucosa was fully examined.        ESOPHAGUS: Moderately severe esophagitis was found in the lower third of the esophagus. Linear erosions and erythema/edema. Multiple biopsies were performed using cold forceps.  Sample sent for histology.  The remainder of the upper endoscopy exam was otherwise normal. Retroflexed views revealed no abnormalities.     The scope was then withdrawn from the patient, a 54 Fr Maloney dilator easily passed and no heme, and the procedure completed.  COMPLICATIONS: There were no complications. ENDOSCOPIC IMPRESSION: 1.   Esophagitis in the lower third of the esophagus; multiple biopsies - suspect GERD 2.   The remainder of the upper endoscopy exam was otherwise normal - 54 Fr Maloney dilation performed due to dysphagia  RECOMMENDATIONS: 1.  Clear liquids until 4PM, then soft foods rest of day.  Resume prior diet tomorrow. 2.  PPI qam - start pantoprazole 40 mg 3.  Colonoscopy next   eSigned:  Iva Boop, MD, [facility] 02/12/2012 2:52 PM   AO:[id number] Magnus Ivan, MD, Gaye Alken, NP and The Patient

## 2012-02-12 NOTE — Progress Notes (Signed)
No complaints noted in the recovery room. Maw  Patient did not experience any of the following events: a burn prior to discharge; a fall within the facility; wrong site/side/patient/procedure/implant event; or a hospital transfer or hospital admission upon discharge from the facility. (G8907) Patient did not have preoperative order for IV antibiotic SSI prophylaxis. (G8918)  

## 2012-02-13 ENCOUNTER — Telehealth: Payer: Self-pay | Admitting: *Deleted

## 2012-02-13 ENCOUNTER — Other Ambulatory Visit: Payer: Self-pay

## 2012-02-13 DIAGNOSIS — C189 Malignant neoplasm of colon, unspecified: Secondary | ICD-10-CM

## 2012-02-13 NOTE — Progress Notes (Signed)
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 Also gave other results.  She needs CT chest w/o contrast to exclude lung mets , abd and pelvis with contrast to evaluate exisiting cecal cancer (she had CT abd/pelvis w/o before)  [facility]  No letter  1 year colon recall  Am ccing her surgeon Dr. Magnus Ivan ______

## 2012-02-13 NOTE — Telephone Encounter (Signed)
  Follow up Call-  Call back number 02/12/2012  Post procedure Call Back phone  # [phone]  Permission to leave phone message Yes     Patient questions:  Do you have a fever, pain , or abdominal swelling? no Pain Score  0 *  Have you tolerated food without any problems? yes  Have you been able to return to your normal activities? yes  Do you have any questions about your discharge instructions: Diet   no Medications  no Follow up visit  no  Do you have questions or concerns about your Care? no  Actions: * If pain score is 4 or above: No action needed, pain <4.

## 2012-02-14 ENCOUNTER — Other Ambulatory Visit (INDEPENDENT_AMBULATORY_CARE_PROVIDER_SITE_OTHER): Payer: Self-pay | Admitting: Surgery

## 2012-02-14 ENCOUNTER — Ambulatory Visit (INDEPENDENT_AMBULATORY_CARE_PROVIDER_SITE_OTHER)
Admission: RE | Admit: 2012-02-14 | Discharge: 2012-02-14 | Disposition: A | Discharge status: Home / Self Care | Payer: BC Managed Care – PPO | Source: Ambulatory Visit | Attending: Internal Medicine | Admitting: Internal Medicine

## 2012-02-14 DIAGNOSIS — C189 Malignant neoplasm of colon, unspecified: Secondary | ICD-10-CM

## 2012-02-14 MED ORDER — IOHEXOL 300 MG/ML  SOLN
100.0000 mL | Freq: Once | INTRAMUSCULAR | Status: AC | PRN
  Administered 2012-02-14: 100 mL via INTRAVENOUS

## 2012-02-14 NOTE — Progress Notes (Signed)
Quick Note:  Results called and also discussed with Dr. Magnus Ivan  He plans surgery next  I agree ______

## 2012-02-28 ENCOUNTER — Encounter (HOSPITAL_COMMUNITY): Payer: Self-pay | Admitting: Pharmacy Technician

## 2012-03-04 ENCOUNTER — Encounter (HOSPITAL_COMMUNITY): Payer: Self-pay

## 2012-03-04 ENCOUNTER — Encounter (HOSPITAL_COMMUNITY)
Admission: RE | Admit: 2012-03-04 | Discharge: 2012-03-04 | Disposition: A | Discharge status: Home / Self Care | Payer: BC Managed Care – PPO | Source: Ambulatory Visit | Attending: Surgery | Admitting: Surgery

## 2012-03-04 DIAGNOSIS — Z01812 Encounter for preprocedural laboratory examination: Secondary | ICD-10-CM | POA: Insufficient documentation

## 2012-03-04 HISTORY — DX: Gastro-esophageal reflux disease without esophagitis: K21.9

## 2012-03-04 HISTORY — DX: Malignant (primary) neoplasm, unspecified: C80.1

## 2012-03-04 LAB — BASIC METABOLIC PANEL
BUN: 11 mg/dL (ref 6–23)
CO2: 24 mEq/L (ref 19–32)
Calcium: 9.3 mg/dL (ref 8.4–10.5)
Chloride: 103 mEq/L (ref 96–112)
Creatinine, Ser: 0.87 mg/dL (ref 0.50–1.10)
GFR calc Af Amer: 90 mL/min — ABNORMAL LOW (ref >90)
GFR calc non Af Amer: 78 mL/min — ABNORMAL LOW (ref >90)
Glucose, Bld: 91 mg/dL (ref 70–99)
Potassium: 4.7 mEq/L (ref 3.5–5.1)
Sodium: 137 mEq/L (ref 135–145)

## 2012-03-04 LAB — CBC
HCT: 33.4 % — ABNORMAL LOW (ref 36.0–46.0)
Hemoglobin: 10.1 g/dL — ABNORMAL LOW (ref 12.0–15.0)
MCH: 20.4 pg — ABNORMAL LOW (ref 26.0–34.0)
MCHC: 30.2 g/dL (ref 30.0–36.0)
MCV: 67.6 fL — ABNORMAL LOW (ref 78.0–100.0)
Platelets: 423 10*3/uL — ABNORMAL HIGH (ref 150–400)
RBC: 4.94 MIL/uL (ref 3.87–5.11)
RDW: 17.8 % — ABNORMAL HIGH (ref 11.5–15.5)
WBC: 9.3 10*3/uL (ref 4.0–10.5)

## 2012-03-04 LAB — SURGICAL PCR SCREEN
MRSA, PCR: POSITIVE — AB
Staphylococcus aureus: POSITIVE — AB

## 2012-03-04 LAB — ABO/RH: ABO/RH(D): A POS

## 2012-03-04 NOTE — Patient Instructions (Signed)
YOUR SURGERY IS SCHEDULED AT [facility]  ON:  WED  3/5  REPORT TO [facility] AT:  9:30 AM      PHONE # FOR SHORT STAY IS [phone]  DO NOT EAT OR DRINK ANYTHING AFTER MIDNIGHT THE NIGHT BEFORE YOUR SURGERY.  YOU MAY BRUSH YOUR TEETH, RINSE OUT YOUR MOUTH--BUT NO WATER, NO FOOD, NO CHEWING GUM, NO MINTS, NO CANDIES, NO CHEWING TOBACCO.  PLEASE TAKE THE FOLLOWING MEDICATIONS THE AM OF YOUR SURGERY WITH A FEW SIPS OF WATER:   PANTOPRAZOLE   DO NOT BRING VALUABLES, MONEY, CREDIT CARDS.  DO NOT WEAR JEWELRY, MAKE-UP, NAIL POLISH AND NO METAL PINS OR CLIPS IN YOUR HAIR. CONTACT LENS, DENTURES / PARTIALS, GLASSES SHOULD NOT BE WORN TO SURGERY AND IN MOST CASES-HEARING AIDS WILL NEED TO BE REMOVED.  BRING YOUR GLASSES CASE, ANY EQUIPMENT NEEDED FOR YOUR CONTACT LENS. FOR PATIENTS ADMITTED TO THE HOSPITAL--CHECK OUT TIME THE DAY OF DISCHARGE IS 11:00 AM.  ALL INPATIENT ROOMS ARE PRIVATE - WITH BATHROOM, TELEPHONE, TELEVISION AND WIFI INTERNET.                             PLEASE READ OVER ANY  FACT SHEETS THAT YOU WERE GIVEN: MRSA INFORMATION, BLOOD TRANSFUSION INFORMATION FAILURE TO FOLLOW THESE INSTRUCTIONS MAY RESULT IN THE CANCELLATION OF YOUR SURGERY.   PATIENT SIGNATURE_________________________________

## 2012-03-04 NOTE — Pre-Procedure Instructions (Addendum)
PREOP CBC, BMET, T/S WERE DONE TODAY AT [facility] AS PER ORDERS DR. Barrie Dunker. CHEST CT REPORT IN EPIC FROM 02/14/12

## 2012-03-11 MED ORDER — GENTAMICIN SULFATE 40 MG/ML IJ SOLN
380.0000 mg | INTRAVENOUS | Status: AC
  Administered 2012-03-12: 380 mg via INTRAVENOUS
  Filled 2012-03-11: qty 9.5

## 2012-03-11 MED ORDER — GENTAMICIN SULFATE 40 MG/ML IJ SOLN
5.0000 mg/kg | INTRAVENOUS | Status: DC
  Filled 2012-03-11: qty 12.53

## 2012-03-11 MED ORDER — CLINDAMYCIN PHOSPHATE 900 MG/50ML IV SOLN
900.0000 mg | INTRAVENOUS | Status: AC
  Administered 2012-03-12: 900 mg via INTRAVENOUS
  Filled 2012-03-11: qty 50

## 2012-03-11 NOTE — H&P (Signed)
Patient ID: Tonya Singh, female DOB: 04-17-1965, 47 y.o. MRN: [id number]  Chief Complaint   Patient presents with   .  Abdominal Pain   HPI  Tonya Singh is a 47 y.o. female.  HPI  This is a pleasant 47 year old female who presented to the emergency department on December 23 with sharp left lower quadrant abdominal pain. She had a CAT scan to rule out a kidney stone. This demonstrated a possible 5 cm cecal mass and a left pelvic mass. She had a urinary tract infection as well. The CAT scan also showed some inflammation in the left lower quadrant. Differential included both the colon and ovarian cancer versus colon cancer with metastatic disease. She was referred here for followup. She has had a previous partial hysterectomy. She reports she had been doing well until this onset of pain. Ever since her hysterectomy, she does have mild left lower quadrant abdominal wall pain. She has recently been constipated but this is improving. She denies nausea, vomiting, abdominal distention, blood per rectum, melena, or weight loss.  History reviewed. No pertinent past medical history.    Past Surgical History   Procedure  Date   .  Abdominal hysterectomy    .  Cesarean section    History reviewed. No pertinent family history.  Social History  History   Substance Use Topics   .  Smoking status:  Former Smoker     Types:  Cigarettes   .  Smokeless tobacco:  Not on file   .  Alcohol Use:  Yes      Comment: occasional    Allergies   Allergen  Reactions   .  Penicillins  Hives and Rash    Current Outpatient Prescriptions   Medication  Sig  Dispense  Refill   .  HYDROcodone-acetaminophen (NORCO/VICODIN) 5-325 MG per tablet  Take 1 tablet by mouth every 6 (six) hours as needed for pain.  30 tablet  0   Review of Systems  Review of Systems  Constitutional: Negative for fever, chills and unexpected weight change.  HENT: Negative for hearing loss, congestion, sore throat, trouble swallowing and  voice change.  Eyes: Negative for visual disturbance.  Respiratory: Negative for cough and wheezing.  Cardiovascular: Negative for chest pain, palpitations and leg swelling.  Gastrointestinal: Positive for abdominal pain and constipation. Negative for nausea, vomiting, diarrhea, blood in stool, abdominal distention and anal bleeding.  Genitourinary: Negative for hematuria, vaginal bleeding and difficulty urinating.  Musculoskeletal: Negative for arthralgias.  Skin: Negative for rash and wound.  Neurological: Negative for seizures, syncope and headaches.  Hematological: Negative for adenopathy. Does not bruise/bleed easily.  Psychiatric/Behavioral: Negative for confusion.  Blood pressure 122/68, pulse 70, temperature 98 F (36.7 C), temperature source Temporal, resp. rate 18, height 5\' 6"  (1.676 m), weight 217 lb 9.6 oz (98.703 kg).  Physical Exam  Physical Exam  Constitutional: She is oriented to person, place, and time. She appears well-developed and well-nourished. No distress.  HENT:  Head: Normocephalic and atraumatic.  Right Ear: External ear normal.  Left Ear: External ear normal.  Nose: Nose normal.  Mouth/Throat: Oropharynx is clear and moist. No oropharyngeal exudate.  Eyes: Conjunctivae normal are normal. Pupils are equal, round, and reactive to light. Right eye exhibits no discharge. Left eye exhibits no discharge. No scleral icterus.  Neck: Normal range of motion. Neck supple. No tracheal deviation present. No thyromegaly present.  Cardiovascular: Normal rate, regular rhythm, normal heart sounds and intact distal pulses.  No murmur heard.  Pulmonary/Chest: Effort normal and breath sounds normal. No respiratory distress. She has no wheezes.  Abdominal: Soft. Bowel sounds are normal. She exhibits no distension and no mass. There is no tenderness. There is no rebound and no guarding.  Musculoskeletal: Normal range of motion. She exhibits no edema and no tenderness.   Neurological: She is alert and oriented to person, place, and time.  Skin: Skin is warm and dry. No rash noted. She is not diaphoretic. No erythema.  Psychiatric: Her behavior is normal. Judgment normal.  Data Reviewed   I have reviewed the patient's CAT scan as well as her laboratory data  Assessment   Cecal mass and left pelvic mass of uncertain etiology  Plan   I discussed this with the patient and her husband in detail. Currently she is asymptomatic and her abdominal exam is benign. I believe she needs a colonoscopy to evaluate the cecum and rest of the colon. I'm going to also evaluate tumor markers. I will check a CEA and CA 125. They are uninsured and multiply until the first week in February to see the gastroenterologist. This may change depending on her markers Or symptoms. I will see her back after she is seen the gastroenterologist unless her symptoms recur. Again, I explained to them the likelihood that this could be cancer. They understand, but they are limited by their financial resources.   Addendum:  Her CEA and CA 125 were normal.  Her colonoscopy showed a large cecal mass with biopsy being positive for adenocarcinoma.  Will proceed with laparoscopic assisted partial colectomy.  The risks of the procedure were discussed in detail.  These risks include but are not limited to bleeding, infection, injury to surrounding structures including the ureters, anastomotic leak or breakdown, need for further surgery, etc.  She understands and agrees to proceed.  Likelihood os success is good.

## 2012-03-12 ENCOUNTER — Encounter (HOSPITAL_COMMUNITY)
Admission: RE | Disposition: A | Discharge status: Home / Self Care | Payer: Self-pay | Source: Ambulatory Visit | Attending: Surgery

## 2012-03-12 ENCOUNTER — Ambulatory Visit (HOSPITAL_COMMUNITY): Payer: BC Managed Care – PPO | Admitting: Anesthesiology

## 2012-03-12 ENCOUNTER — Inpatient Hospital Stay (HOSPITAL_COMMUNITY)
Admission: RE | Admit: 2012-03-12 | Discharge: 2012-03-16 | DRG: 149 | Disposition: A | Discharge status: Home / Self Care | Payer: BC Managed Care – PPO | Source: Ambulatory Visit | Attending: Surgery | Admitting: Surgery

## 2012-03-12 ENCOUNTER — Encounter (HOSPITAL_COMMUNITY): Payer: Self-pay | Admitting: *Deleted

## 2012-03-12 ENCOUNTER — Encounter (HOSPITAL_COMMUNITY): Payer: Self-pay | Admitting: Anesthesiology

## 2012-03-12 DIAGNOSIS — Z01812 Encounter for preprocedural laboratory examination: Secondary | ICD-10-CM

## 2012-03-12 DIAGNOSIS — N83209 Unspecified ovarian cyst, unspecified side: Secondary | ICD-10-CM | POA: Diagnosis present

## 2012-03-12 DIAGNOSIS — Z5331 Laparoscopic surgical procedure converted to open procedure: Secondary | ICD-10-CM

## 2012-03-12 DIAGNOSIS — C180 Malignant neoplasm of cecum: Principal | ICD-10-CM | POA: Diagnosis present

## 2012-03-12 DIAGNOSIS — C189 Malignant neoplasm of colon, unspecified: Secondary | ICD-10-CM | POA: Diagnosis present

## 2012-03-12 HISTORY — PX: LAPAROSCOPIC PARTIAL COLECTOMY: SHX5907

## 2012-03-12 LAB — TYPE AND SCREEN
ABO/RH(D): A POS
Antibody Screen: NEGATIVE

## 2012-03-12 SURGERY — LAPAROSCOPIC PARTIAL COLECTOMY
Anesthesia: General | Site: Abdomen | Wound class: Clean Contaminated

## 2012-03-12 MED ORDER — MIDAZOLAM HCL 5 MG/5ML IJ SOLN
INTRAMUSCULAR | Status: DC | PRN
  Administered 2012-03-12: 2 mg via INTRAVENOUS

## 2012-03-12 MED ORDER — SODIUM CHLORIDE 0.9 % IJ SOLN: 9.0000 mL | INTRAMUSCULAR | Status: DC | PRN

## 2012-03-12 MED ORDER — ONDANSETRON HCL 4 MG/2ML IJ SOLN
INTRAMUSCULAR | Status: DC | PRN
  Administered 2012-03-12: 4 mg via INTRAVENOUS

## 2012-03-12 MED ORDER — LACTATED RINGERS IV SOLN
INTRAVENOUS | Status: DC
  Administered 2012-03-12 (×2): via INTRAVENOUS
  Administered 2012-03-12: 1000 mL via INTRAVENOUS

## 2012-03-12 MED ORDER — HYDROMORPHONE 0.3 MG/ML IV SOLN
INTRAVENOUS | Status: DC
  Administered 2012-03-12: 1.34 mg via INTRAVENOUS
  Administered 2012-03-12: via INTRAVENOUS
  Administered 2012-03-12: 5.4 mg via INTRAVENOUS
  Administered 2012-03-12: 14:00:00 via INTRAVENOUS
  Administered 2012-03-13: 2.7 mg via INTRAVENOUS
  Administered 2012-03-13 (×2): 1.8 mg via INTRAVENOUS
  Filled 2012-03-12: qty 25

## 2012-03-12 MED ORDER — ENOXAPARIN SODIUM 40 MG/0.4ML ~~LOC~~ SOLN
40.0000 mg | SUBCUTANEOUS | Status: DC
  Administered 2012-03-13 – 2012-03-16 (×4): 40 mg via SUBCUTANEOUS
  Filled 2012-03-12 (×5): qty 0.4

## 2012-03-12 MED ORDER — ONDANSETRON HCL 4 MG/2ML IJ SOLN
4.0000 mg | Freq: Four times a day (QID) | INTRAMUSCULAR | Status: DC | PRN
  Administered 2012-03-12 – 2012-03-13 (×2): 4 mg via INTRAVENOUS
  Filled 2012-03-12 (×2): qty 2

## 2012-03-12 MED ORDER — DIPHENHYDRAMINE HCL 12.5 MG/5ML PO ELIX: 12.5000 mg | ORAL_SOLUTION | Freq: Four times a day (QID) | ORAL | Status: DC | PRN

## 2012-03-12 MED ORDER — FENTANYL CITRATE 0.05 MG/ML IJ SOLN
INTRAMUSCULAR | Status: DC | PRN
  Administered 2012-03-12 (×5): 50 ug via INTRAVENOUS

## 2012-03-12 MED ORDER — ALVIMOPAN 12 MG PO CAPS
12.0000 mg | ORAL_CAPSULE | Freq: Two times a day (BID) | ORAL | Status: DC
  Administered 2012-03-13 – 2012-03-14 (×4): 12 mg via ORAL
  Filled 2012-03-12 (×6): qty 1

## 2012-03-12 MED ORDER — HYDROMORPHONE HCL PF 1 MG/ML IJ SOLN
INTRAMUSCULAR | Status: DC | PRN
  Administered 2012-03-12: 1 mg via INTRAVENOUS

## 2012-03-12 MED ORDER — ROCURONIUM BROMIDE 100 MG/10ML IV SOLN
INTRAVENOUS | Status: DC | PRN
  Administered 2012-03-12: 40 mg via INTRAVENOUS

## 2012-03-12 MED ORDER — HYDROMORPHONE HCL PF 1 MG/ML IJ SOLN
0.2500 mg | INTRAMUSCULAR | Status: DC | PRN
  Administered 2012-03-12 (×2): 0.5 mg via INTRAVENOUS

## 2012-03-12 MED ORDER — LACTATED RINGERS IR SOLN
Status: DC | PRN
  Administered 2012-03-12: 1000 mL

## 2012-03-12 MED ORDER — ACETAMINOPHEN 10 MG/ML IV SOLN
1000.0000 mg | Freq: Four times a day (QID) | INTRAVENOUS | Status: AC
  Administered 2012-03-12 – 2012-03-13 (×4): 1000 mg via INTRAVENOUS
  Filled 2012-03-12 (×4): qty 100

## 2012-03-12 MED ORDER — MEPERIDINE HCL 50 MG/ML IJ SOLN: 6.2500 mg | INTRAMUSCULAR | Status: DC | PRN

## 2012-03-12 MED ORDER — PROPOFOL 10 MG/ML IV BOLUS
INTRAVENOUS | Status: DC | PRN
  Administered 2012-03-12: 160 mg via INTRAVENOUS

## 2012-03-12 MED ORDER — ALVIMOPAN 12 MG PO CAPS
12.0000 mg | ORAL_CAPSULE | Freq: Once | ORAL | Status: AC
  Administered 2012-03-12: 12 mg via ORAL
  Filled 2012-03-12: qty 1

## 2012-03-12 MED ORDER — DEXAMETHASONE SODIUM PHOSPHATE 10 MG/ML IJ SOLN
INTRAMUSCULAR | Status: DC | PRN
  Administered 2012-03-12: 10 mg via INTRAVENOUS

## 2012-03-12 MED ORDER — CLINDAMYCIN PHOSPHATE 900 MG/50ML IV SOLN
INTRAVENOUS | Status: AC
  Filled 2012-03-12: qty 50

## 2012-03-12 MED ORDER — NEOSTIGMINE METHYLSULFATE 1 MG/ML IJ SOLN
INTRAMUSCULAR | Status: DC | PRN
  Administered 2012-03-12: 5 mg via INTRAVENOUS

## 2012-03-12 MED ORDER — HYDROMORPHONE HCL PF 1 MG/ML IJ SOLN
INTRAMUSCULAR | Status: AC
  Filled 2012-03-12: qty 1

## 2012-03-12 MED ORDER — DIPHENHYDRAMINE HCL 50 MG/ML IJ SOLN
12.5000 mg | Freq: Four times a day (QID) | INTRAMUSCULAR | Status: DC | PRN
  Filled 2012-03-12: qty 1

## 2012-03-12 MED ORDER — LACTATED RINGERS IV SOLN: INTRAVENOUS | Status: DC

## 2012-03-12 MED ORDER — HYDROMORPHONE 0.3 MG/ML IV SOLN
INTRAVENOUS | Status: AC
  Filled 2012-03-12: qty 25

## 2012-03-12 MED ORDER — POTASSIUM CHLORIDE IN NACL 20-0.9 MEQ/L-% IV SOLN
INTRAVENOUS | Status: DC
  Administered 2012-03-12: 19:00:00 via INTRAVENOUS
  Administered 2012-03-13: 1000 mL via INTRAVENOUS
  Administered 2012-03-13 – 2012-03-14 (×2): via INTRAVENOUS
  Administered 2012-03-14: 1000 mL via INTRAVENOUS
  Administered 2012-03-15: 11:00:00 via INTRAVENOUS
  Filled 2012-03-12 (×8): qty 1000

## 2012-03-12 MED ORDER — LIDOCAINE HCL (CARDIAC) 20 MG/ML IV SOLN
INTRAVENOUS | Status: DC | PRN
  Administered 2012-03-12: 100 mg via INTRAVENOUS

## 2012-03-12 MED ORDER — PROMETHAZINE HCL 25 MG/ML IJ SOLN: 6.2500 mg | INTRAMUSCULAR | Status: DC | PRN

## 2012-03-12 MED ORDER — BUPIVACAINE-EPINEPHRINE 0.25% -1:200000 IJ SOLN
INTRAMUSCULAR | Status: AC
  Filled 2012-03-12: qty 1

## 2012-03-12 MED ORDER — NALOXONE HCL 0.4 MG/ML IJ SOLN: 0.4000 mg | INTRAMUSCULAR | Status: DC | PRN

## 2012-03-12 MED ORDER — ONDANSETRON HCL 4 MG PO TABS: 4.0000 mg | ORAL_TABLET | Freq: Four times a day (QID) | ORAL | Status: DC | PRN

## 2012-03-12 MED ORDER — SUCCINYLCHOLINE CHLORIDE 20 MG/ML IJ SOLN
INTRAMUSCULAR | Status: DC | PRN
  Administered 2012-03-12: 100 mg via INTRAVENOUS

## 2012-03-12 MED ORDER — BUPIVACAINE-EPINEPHRINE 0.25% -1:200000 IJ SOLN
INTRAMUSCULAR | Status: DC | PRN
  Administered 2012-03-12: 20 mL

## 2012-03-12 MED ORDER — ACETAMINOPHEN 10 MG/ML IV SOLN
INTRAVENOUS | Status: AC
  Filled 2012-03-12: qty 100

## 2012-03-12 MED ORDER — ACETAMINOPHEN 10 MG/ML IV SOLN
INTRAVENOUS | Status: DC | PRN
  Administered 2012-03-12: 1000 mg via INTRAVENOUS

## 2012-03-12 MED ORDER — ONDANSETRON HCL 4 MG/2ML IJ SOLN: 4.0000 mg | Freq: Four times a day (QID) | INTRAMUSCULAR | Status: DC | PRN

## 2012-03-12 SURGICAL SUPPLY — 72 items
APPLIER CLIP 5 13 M/L LIGAMAX5 (MISCELLANEOUS) ×2
APPLIER CLIP ROT 10 11.4 M/L (STAPLE)
BLADE EXTENDED COATED 6.5IN (ELECTRODE) ×2 IMPLANT
BLADE HEX COATED 2.75 (ELECTRODE) ×2 IMPLANT
BLADE SURG SZ10 CARB STEEL (BLADE) ×4 IMPLANT
CABLE HIGH FREQUENCY MONO STRZ (ELECTRODE) ×2 IMPLANT
CANISTER SUCTION 2500CC (MISCELLANEOUS) ×2 IMPLANT
CELLS DAT CNTRL 66122 CELL SVR (MISCELLANEOUS) ×2 IMPLANT
CHLORAPREP W/TINT 26ML (MISCELLANEOUS) ×2 IMPLANT
CLIP APPLIE 5 13 M/L LIGAMAX5 (MISCELLANEOUS) ×1 IMPLANT
CLIP APPLIE ROT 10 11.4 M/L (STAPLE) IMPLANT
CLOTH BEACON ORANGE TIMEOUT ST (SAFETY) ×2 IMPLANT
COVER MAYO STAND STRL (DRAPES) ×2 IMPLANT
DECANTER SPIKE VIAL GLASS SM (MISCELLANEOUS) ×2 IMPLANT
DRAIN CHANNEL 19F RND (DRAIN) IMPLANT
DRAPE LAPAROSCOPIC ABDOMINAL (DRAPES) ×2 IMPLANT
DRAPE LG THREE QUARTER DISP (DRAPES) ×2 IMPLANT
DRAPE WARM FLUID 44X44 (DRAPE) ×4 IMPLANT
DRSG AQUACEL AG ADV 3.5X 4 (GAUZE/BANDAGES/DRESSINGS) IMPLANT
DRSG AQUACEL AG ADV 3.5X 6 (GAUZE/BANDAGES/DRESSINGS) IMPLANT
DRSG TEGADERM 4X4.75 (GAUZE/BANDAGES/DRESSINGS) ×2 IMPLANT
ELECT REM PT RETURN 9FT ADLT (ELECTROSURGICAL) ×2
ELECTRODE REM PT RTRN 9FT ADLT (ELECTROSURGICAL) ×1 IMPLANT
EVACUATOR DRAINAGE 10X20 100CC (DRAIN) IMPLANT
EVACUATOR SILICONE 100CC (DRAIN)
GLOVE BIOGEL PI IND STRL 7.0 (GLOVE) ×1 IMPLANT
GLOVE BIOGEL PI INDICATOR 7.0 (GLOVE) ×1
GLOVE SURG SIGNA 7.5 PF LTX (GLOVE) ×8 IMPLANT
GOWN STRL NON-REIN LRG LVL3 (GOWN DISPOSABLE) IMPLANT
GOWN STRL REIN XL XLG (GOWN DISPOSABLE) ×12 IMPLANT
HAND ACTIVATED (MISCELLANEOUS) ×2 IMPLANT
KIT BASIN OR (CUSTOM PROCEDURE TRAY) ×2 IMPLANT
LEGGING LITHOTOMY PAIR STRL (DRAPES) IMPLANT
LIGASURE IMPACT 36 18CM CVD LR (INSTRUMENTS) ×2 IMPLANT
NS IRRIG 1000ML POUR BTL (IV SOLUTION) ×12 IMPLANT
PENCIL BUTTON HOLSTER BLD 10FT (ELECTRODE) ×4 IMPLANT
REGULATOR SUCTION ADULT (MISCELLANEOUS) IMPLANT
RELOAD PROXIMATE 75MM BLUE (ENDOMECHANICALS) ×6 IMPLANT
RTRCTR WOUND ALEXIS 18CM MED (MISCELLANEOUS) ×4
SCISSORS LAP 5X35 DISP (ENDOMECHANICALS) ×2 IMPLANT
SEALER TISSUE G2 CVD JAW 45CM (ENDOMECHANICALS) IMPLANT
SEALER TISSUE X1 CVD JAW (INSTRUMENTS) IMPLANT
SET IRRIG TUBING LAPAROSCOPIC (IRRIGATION / IRRIGATOR) ×2 IMPLANT
SOLUTION ANTI FOG 6CC (MISCELLANEOUS) ×2 IMPLANT
SPONGE GAUZE 4X4 12PLY (GAUZE/BANDAGES/DRESSINGS) ×2 IMPLANT
SPONGE LAP 18X18 X RAY DECT (DISPOSABLE) ×2 IMPLANT
STAPLER GUN LINEAR PROX 60 (STAPLE) ×2 IMPLANT
STAPLER PROXIMATE 75MM BLUE (STAPLE) ×4 IMPLANT
STAPLER VISISTAT 35W (STAPLE) ×2 IMPLANT
SUCTION POOLE TIP (SUCTIONS) ×2 IMPLANT
SUT NYLON 3 0 (SUTURE) IMPLANT
SUT PDS AB 1 CTX 36 (SUTURE) IMPLANT
SUT PDS AB 1 TP1 96 (SUTURE) ×4 IMPLANT
SUT PROLENE 2 0 KS (SUTURE) IMPLANT
SUT SILK 2 0 (SUTURE) ×1
SUT SILK 2 0 SH CR/8 (SUTURE) ×2 IMPLANT
SUT SILK 2-0 18XBRD TIE 12 (SUTURE) ×1 IMPLANT
SUT SILK 3 0 (SUTURE) ×1
SUT SILK 3 0 SH CR/8 (SUTURE) ×2 IMPLANT
SUT SILK 3-0 18XBRD TIE 12 (SUTURE) ×1 IMPLANT
SUT VIC AB 2-0 SH 18 (SUTURE) IMPLANT
SUT VICRYL 2 0 18  UND BR (SUTURE) ×1
SUT VICRYL 2 0 18 UND BR (SUTURE) ×1 IMPLANT
SYR 30ML LL (SYRINGE) IMPLANT
TOWEL OR 17X26 10 PK STRL BLUE (TOWEL DISPOSABLE) ×2 IMPLANT
TRAY FOLEY CATH 14FRSI W/METER (CATHETERS) ×2 IMPLANT
TRAY LAP CHOLE (CUSTOM PROCEDURE TRAY) ×2 IMPLANT
TROCAR XCEL BLUNT TIP 100MML (ENDOMECHANICALS) IMPLANT
TROCAR XCEL NON-BLD 5MMX100MML (ENDOMECHANICALS) ×2 IMPLANT
TUBING FILTER THERMOFLATOR (ELECTROSURGICAL) ×2 IMPLANT
YANKAUER SUCT BULB TIP 10FT TU (MISCELLANEOUS) ×2 IMPLANT
YANKAUER SUCT BULB TIP NO VENT (SUCTIONS) ×2 IMPLANT

## 2012-03-12 NOTE — Anesthesia Postprocedure Evaluation (Signed)
  Anesthesia Post-op Note  Patient: Tonya Singh  Procedure(s) Performed: Procedure(s) (LRB): LAPAROSCOPIC Assisted PARTIAL COLECTOMY (N/A)  Patient Location: PACU  Anesthesia Type: General  Level of Consciousness: awake and alert   Airway and Oxygen Therapy: Patient Spontanous Breathing  Post-op Pain: mild  Post-op Assessment: Post-op Vital signs reviewed, Patient's Cardiovascular Status Stable, Respiratory Function Stable, Patent Airway and No signs of Nausea or vomiting  Last Vitals:  Filed Vitals:   03/12/12 1445  BP: 135/65  Pulse: 53  Temp: 36.7 C  Resp: 16    Post-op Vital Signs: stable   Complications: No apparent anesthesia complications

## 2012-03-12 NOTE — Anesthesia Preprocedure Evaluation (Addendum)

## 2012-03-12 NOTE — Transfer of Care (Signed)
Immediate Anesthesia Transfer of Care Note  Patient: Tonya Singh  Procedure(s) Performed: Procedure(s): LAPAROSCOPIC Assisted PARTIAL COLECTOMY (N/A)  Patient Location: PACU  Anesthesia Type:General  Level of Consciousness: sedated  Airway & Oxygen Therapy: Patient Spontanous Breathing and Patient connected to face mask oxygen  Post-op Assessment: Report given to PACU RN and Post -op Vital signs reviewed and stable  Post vital signs: Reviewed and stable  Complications: No apparent anesthesia complications

## 2012-03-12 NOTE — Interval H&P Note (Signed)
History and Physical Interval Note: no change in H and P  03/12/2012 11:04 AM  Tonya Singh  has presented today for surgery, with the diagnosis of colon cancer  The various methods of treatment have been discussed with the patient and family. After consideration of risks, benefits and other options for treatment, the patient has consented to  Procedure(s): LAPAROSCOPIC Assisted PARTIAL COLECTOMY (N/A) as a surgical intervention .  The patient's history has been reviewed, patient examined, no change in status, stable for surgery.  I have reviewed the patient's chart and labs.  Questions were answered to the patient's satisfaction.     [name],[name]

## 2012-03-12 NOTE — Op Note (Signed)
LAPAROSCOPIC Assisted PARTIAL COLECTOMY  Procedure Note  Tonya Singh 03/12/2012   Pre-op Diagnosis: colon cancer     Post-op Diagnosis: same  Procedure(s): LAPAROSCOPIC Assisted RIGHT PARTIAL COLECTOMY  Surgeon(s): Shelly Rubenstein, MD  Anesthesia: General  Staff:  Circulator: Roosvelt Harps, RN; Duard Larsen, RN Scrub Person: April C McKinney, CST; Cardell Peach, RN; Beryl Meager, CST  Estimated Blood Loss: Minimal               Specimens: right colon          [name],[name]   Date: 03/12/2012  Time: 2:02 PM

## 2012-03-13 ENCOUNTER — Encounter (HOSPITAL_COMMUNITY): Payer: Self-pay | Admitting: Surgery

## 2012-03-13 LAB — BASIC METABOLIC PANEL
BUN: 11 mg/dL (ref 6–23)
CO2: 24 mEq/L (ref 19–32)
Calcium: 8.3 mg/dL — ABNORMAL LOW (ref 8.4–10.5)
Chloride: 103 mEq/L (ref 96–112)
Creatinine, Ser: 0.82 mg/dL (ref 0.50–1.10)
GFR calc Af Amer: >90 mL/min (ref >90)
GFR calc non Af Amer: 83 mL/min — ABNORMAL LOW (ref >90)
Glucose, Bld: 127 mg/dL — ABNORMAL HIGH (ref 70–99)
Potassium: 5 mEq/L (ref 3.5–5.1)
Sodium: 134 mEq/L — ABNORMAL LOW (ref 135–145)

## 2012-03-13 LAB — CBC
HCT: 28.4 % — ABNORMAL LOW (ref 36.0–46.0)
Hemoglobin: 8.5 g/dL — ABNORMAL LOW (ref 12.0–15.0)
MCH: 20.4 pg — ABNORMAL LOW (ref 26.0–34.0)
MCHC: 29.9 g/dL — ABNORMAL LOW (ref 30.0–36.0)
MCV: 68.1 fL — ABNORMAL LOW (ref 78.0–100.0)
Platelets: 322 10*3/uL (ref 150–400)
RBC: 4.17 MIL/uL (ref 3.87–5.11)
RDW: 17.7 % — ABNORMAL HIGH (ref 11.5–15.5)
WBC: 12.9 10*3/uL — ABNORMAL HIGH (ref 4.0–10.5)

## 2012-03-13 MED ORDER — MORPHINE SULFATE (PF) 1 MG/ML IV SOLN
INTRAVENOUS | Status: DC
  Administered 2012-03-13: 15:00:00 via INTRAVENOUS
  Administered 2012-03-13: 13.5 mg via INTRAVENOUS
  Administered 2012-03-13: 5.36 mg via INTRAVENOUS
  Administered 2012-03-13: 23:00:00 via INTRAVENOUS
  Administered 2012-03-14: 7.3 mg via INTRAVENOUS
  Administered 2012-03-14: 25 mg via INTRAVENOUS
  Administered 2012-03-14: 15 mg via INTRAVENOUS
  Administered 2012-03-14: 21:00:00 via INTRAVENOUS
  Administered 2012-03-14: 8.67 mg via INTRAVENOUS
  Administered 2012-03-14: 15 mg via INTRAVENOUS
  Administered 2012-03-15: 7.29 mg via INTRAVENOUS
  Administered 2012-03-15: 6.2 mg via INTRAVENOUS
  Administered 2012-03-15: 8.34 mg via INTRAVENOUS
  Administered 2012-03-15: 6 mg via INTRAVENOUS
  Filled 2012-03-13 (×5): qty 25

## 2012-03-13 MED ORDER — ONDANSETRON HCL 4 MG/2ML IJ SOLN
4.0000 mg | Freq: Four times a day (QID) | INTRAMUSCULAR | Status: DC | PRN
  Administered 2012-03-13 – 2012-03-14 (×3): 4 mg via INTRAVENOUS
  Filled 2012-03-13 (×3): qty 2

## 2012-03-13 MED ORDER — DIPHENHYDRAMINE HCL 12.5 MG/5ML PO ELIX: 12.5000 mg | ORAL_SOLUTION | Freq: Four times a day (QID) | ORAL | Status: DC | PRN

## 2012-03-13 MED ORDER — SODIUM CHLORIDE 0.9 % IJ SOLN: 9.0000 mL | INTRAMUSCULAR | Status: DC | PRN

## 2012-03-13 MED ORDER — NALOXONE HCL 0.4 MG/ML IJ SOLN: 0.4000 mg | INTRAMUSCULAR | Status: DC | PRN

## 2012-03-13 MED ORDER — DIPHENHYDRAMINE HCL 50 MG/ML IJ SOLN: 12.5000 mg | Freq: Four times a day (QID) | INTRAMUSCULAR | Status: DC | PRN

## 2012-03-13 NOTE — Progress Notes (Signed)
1 Day Post-Op  Subjective: POD#1 Nauseated with the IV pain meds Otherwise ok  Objective: Vital signs in last 24 hours: Temp:  [97.1 F (36.2 C)-98.1 F (36.7 C)] 98.1 F (36.7 C) (03/06 1103) Pulse Rate:  [42-66] 47 (03/06 1103) Resp:  [14-20] 19 (03/06 1200) BP: (101-148)/(64-85) 107/72 mmHg (03/06 1103) SpO2:  [80 %-100 %] 100 % (03/06 1200) Weight:  [205 lb 11 oz (93.3 kg)] 205 lb 11 oz (93.3 kg) (03/05 1600) Last BM Date: 03/12/12  Intake/Output from previous day: 03/05 0701 - 03/06 0700 In: 4400 [I.V.:4400] Out: 1195 [Urine:1175; Blood:20] Intake/Output this shift: Total I/O In: -  Out: 300 [Urine:300]  Abdomen soft, dressing dry and intact  Lab Results:   Recent Labs  03/13/12 0515  WBC 12.9*  HGB 8.5*  HCT 28.4*  PLT 322   BMET  Recent Labs  03/13/12 0515  NA 134*  K 5.0  CL 103  CO2 24  GLUCOSE 127*  BUN 11  CREATININE 0.82  CALCIUM 8.3*   PT/INR No results found for this basename: LABPROT, INR,  in the last 72 hours ABG No results found for this basename: PHART, PCO2, PO2, HCO3,  in the last 72 hours  Studies/Results: No results found.  Anti-infectives: Anti-infectives   Start     Dose/Rate Route Frequency Ordered Stop   03/12/12 0600  clindamycin (CLEOCIN) IVPB 900 mg     900 mg 100 mL/hr over 30 Minutes Intravenous On call to O.R. 03/11/12 1523 03/12/12 1213   03/12/12 0600  gentamicin (GARAMYCIN) 501.2 mg in dextrose 5 % 100 mL IVPB  Status:  Discontinued     5 mg/kg  100.2 kg 112.5 mL/hr over 60 Minutes Intravenous On call to O.R. 03/11/12 1523 03/11/12 1525   03/12/12 0000  gentamicin (GARAMYCIN) 380 mg in dextrose 5 % 50 mL IVPB     380 mg 119 mL/hr over 30 Minutes Intravenous 30 min pre-op 03/11/12 1527 03/12/12 1230      Assessment/Plan: s/p Procedure(s): LAPAROSCOPIC Assisted PARTIAL COLECTOMY (N/A)  Change PCA Start clears Decrease IVF  LOS: 1 day    [name],[name] 03/13/2012

## 2012-03-13 NOTE — Op Note (Signed)
Tonya Singh, Tonya Singh NO.:  [id number]  MEDICAL RECORD NO.:  [id number]  LOCATION:  1521                         FACILITY:  [facility]  PHYSICIAN:  Abigail Miyamoto, M.D. DATE OF BIRTH:  08-May-1965  DATE OF PROCEDURE:  03/12/2012 DATE OF DISCHARGE:                              OPERATIVE REPORT   PREOPERATIVE DIAGNOSIS:  Colon cancer.  POSTOPERATIVE DIAGNOSIS:  Colon cancer.  PROCEDURE:  Laparoscopic-assisted right partial colectomy.  SURGEON:  Abigail Miyamoto, M.D.  ANESTHESIA:  General and 0.25% Marcaine.  ESTIMATED BLOOD LOSS:  Minimal.  INDICATIONS:  This is a 47 year old female, who was found to have a large mass in her cecum.  This was identified while she was having a CAT scan for left-sided abdominal pain.  There was also question of a mass on the ovary.  She underwent a colonoscopy which demonstrated a large mass in the cecum.  Her preoperative CEA and CA-125 was normal.  Follow up CAT scan showed the process and the ovary was consistent more with an ovarian cyst.  FINDINGS:  The patient was indeed found to have a large mass in the cecum which appeared contained to the cecum. There was no other intra- abdominal evidence, despite a malignancy.  The left ovary was consistent with a hemorrhagic ovarian cyst, without evidence of malignancy.  PROCEDURE IN DETAIL:  The patient was brought to the operating room, identified as Tonya Singh.  She was placed supine on the operating room table, and general anesthesia was induced.  Her abdomen was then prepped and draped in the usual sterile fashion.  I made a small incision above the umbilicus.  I took this down to the fascia with electrocautery.  The fascia was then opened under direct vision.  A hemostat was used to pass to the peritoneal cavity under direct vision. A 0 Vicryl pursestring suture was then placed around the fascial opening.  The Hasson port was placed at the opening and insufflation  of the abdomen was begun.  I placed another 5 mm port in the upper midline and another below the umbilicus under direct vision.  Two 5 mm ports were then placed through these incisions.  I was able to easily identify the large cecum with a firm mass.  The cecum was, however, mobile.  I then took down the attachments and mobilized the entire right colon on the white line of Toldt with the Harmonic Scalpel.  I then took down the hepatic flexure and adhesions to the dome of the liver with electrocautery as well.  I was able to easily evaluate the liver and felt no evidence of metastatic spread.  There were also no implants on the abdomen.  I was able to take the omentum off the colon with Harmonic Scalpel as well, and was able to mobilize the rest of the colon and the terminal ileum with the Harmonic Scalpel.  At this point, I then turned my attention to the pelvis and evaluated the ovaries.  The right ovary was normal.  The left ovary had findings consistent with a ruptured hemorrhagic ovarian cysts.  There did not appeared to be evidence of malignancy.  Some hematoma was extruded from the cyst itself.  At this point, I then made a determination to convert to the open procedure.  I removed all ports and deflated the abdomen.  I then connected the umbilical incision with the upper midline incision with a scalpel, and took this down to the fascia with electrocautery.  I then opened the fascia between these 2 openings with the cautery.  I then placed a wound protector in the incision.  I was able to easily pull the cecum and entire right colon hepatic flexure up through the incision.  I then transected the distal ileum with a GIA 75 stapler.  I then transected the just distal to the hepatic flexure with GIA 75 stapler as well.  I then took down the mesentery with the ligature cautery device.  There were a few small or large lymph nodes in the mesentery.  Once the entire mesentery was taken  down with the ligature, the specimen was completely removed and sent to pathology for evaluation.  I then reapproximated the small intestines to the remaining colon in a side-to-side fashion with interrupted silk sutures.  I then performed a colotomy and enterotomy with the electrocautery.  I then performed a side-to-side anastomosis with a single firing of the GIA 75 stapler.  I fired on staplers, but only cut halfway, but they still appeared to create a several cm wide anastomosis.  I then closed the opening with a TA 60 stapler.  I reinforced staple line with interrupted silk sutures.  Again, the wide anastomosis appeared to be achieved.  It appeared pink and well perfused as well.  I then reduced this back into the abdominal cavity.  We then redraped gown and gloves.  The abdomen was then thoroughly irrigated with normal saline.  Hemostasis appeared to be achieved.  The midline fascia was then closed with running #1 looped PDS suture.  The skin was then irrigated, anesthetized with Marcaine and closed in skin staples. Occlusive dressing was then applied.  The patient tolerated the procedure well.  All sponge, needle, and instrument counts were correct at the end of the procedure.  The patient was then extubated in the operating room and taken in stable condition to recovery room.     Abigail Miyamoto, M.D.     DB/MEDQ  D:  03/12/2012  T:  03/13/2012  Job:  [id number]

## 2012-03-13 NOTE — Progress Notes (Signed)
UR completed 

## 2012-03-14 NOTE — Progress Notes (Signed)
2 Days Post-Op  Subjective: POD#2 Complains of mild nausea Voiding oK  Objective: Vital signs in last 24 hours: Temp:  [97.8 F (36.6 C)-98.5 F (36.9 C)] 98.5 F (36.9 C) (03/07 1000) Pulse Rate:  [47-60] 57 (03/07 1000) Resp:  [15-22] 16 (03/07 1000) BP: (118-162)/(72-89) 118/72 mmHg (03/07 1000) SpO2:  [92 %-99 %] 92 % (03/07 1000) Last BM Date: 03/12/12  Intake/Output from previous day: 03/06 0701 - 03/07 0700 In: 1040 [P.O.:240; I.V.:800] Out: 500 [Urine:500] Intake/Output this shift: Total I/O In: -  Out: 200 [Urine:200]  Abdomen soft, nondistended Dressing dry  Lab Results:   Recent Labs  03/13/12 0515  WBC 12.9*  HGB 8.5*  HCT 28.4*  PLT 322   BMET  Recent Labs  03/13/12 0515  NA 134*  K 5.0  CL 103  CO2 24  GLUCOSE 127*  BUN 11  CREATININE 0.82  CALCIUM 8.3*   PT/INR No results found for this basename: LABPROT, INR,  in the last 72 hours ABG No results found for this basename: PHART, PCO2, PO2, HCO3,  in the last 72 hours  Studies/Results: No results found.  Anti-infectives: Anti-infectives   Start     Dose/Rate Route Frequency Ordered Stop   03/12/12 0600  clindamycin (CLEOCIN) IVPB 900 mg     900 mg 100 mL/hr over 30 Minutes Intravenous On call to O.R. 03/11/12 1523 03/12/12 1213   03/12/12 0600  gentamicin (GARAMYCIN) 501.2 mg in dextrose 5 % 100 mL IVPB  Status:  Discontinued     5 mg/kg  100.2 kg 112.5 mL/hr over 60 Minutes Intravenous On call to O.R. 03/11/12 1523 03/11/12 1525   03/12/12 0000  gentamicin (GARAMYCIN) 380 mg in dextrose 5 % 50 mL IVPB     380 mg 119 mL/hr over 30 Minutes Intravenous 30 min pre-op 03/11/12 1527 03/12/12 1230      Assessment/Plan: s/p Procedure(s): LAPAROSCOPIC Assisted PARTIAL COLECTOMY (N/A)  Keep on clear liquids today  LOS: 2 days    [name],[name] 03/14/2012

## 2012-03-15 ENCOUNTER — Encounter (HOSPITAL_COMMUNITY): Payer: Self-pay

## 2012-03-15 MED ORDER — OXYCODONE-ACETAMINOPHEN 5-325 MG PO TABS
1.0000 | ORAL_TABLET | ORAL | Status: DC | PRN
  Administered 2012-03-15: 1 via ORAL
  Administered 2012-03-15 (×2): 2 via ORAL
  Administered 2012-03-16: 1 via ORAL
  Administered 2012-03-16: 2 via ORAL
  Administered 2012-03-16: 1 via ORAL
  Filled 2012-03-15: qty 2
  Filled 2012-03-15 (×3): qty 1
  Filled 2012-03-15 (×2): qty 2

## 2012-03-15 MED ORDER — PANTOPRAZOLE SODIUM 40 MG PO TBEC
40.0000 mg | DELAYED_RELEASE_TABLET | Freq: Every day | ORAL | Status: DC
  Administered 2012-03-15 – 2012-03-16 (×2): 40 mg via ORAL
  Filled 2012-03-15 (×2): qty 1

## 2012-03-15 NOTE — Progress Notes (Signed)
3 Days Post-Op  Subjective: POD#3 Feels better, had 2 loose BM's.  Ambulating, Urinating well.  Objective: Vital signs in last 24 hours: Temp:  [97.5 F (36.4 C)-98.4 F (36.9 C)] 98 F (36.7 C) (03/08 0923) Pulse Rate:  [56-65] 60 (03/08 0923) Resp:  [14-18] 16 (03/08 0923) BP: (132-152)/(69-85) 152/83 mmHg (03/08 0923) SpO2:  [97 %-100 %] 97 % (03/08 0923) Last BM Date: 03/15/12 (small)  Intake/Output from previous day: 03/07 0701 - 03/08 0700 In: 1300 [I.V.:1300] Out: 200 [Urine:200] Intake/Output this shift: Total I/O In: 240 [P.O.:240] Out: -   Abdomen soft, nondistended Dressing dry  Lab Results:   Recent Labs  03/13/12 0515  WBC 12.9*  HGB 8.5*  HCT 28.4*  PLT 322   BMET  Recent Labs  03/13/12 0515  NA 134*  K 5.0  CL 103  CO2 24  GLUCOSE 127*  BUN 11  CREATININE 0.82  CALCIUM 8.3*   PT/INR No results found for this basename: LABPROT, INR,  in the last 72 hours ABG No results found for this basename: PHART, PCO2, PO2, HCO3,  in the last 72 hours  Studies/Results: No results found.  Anti-infectives: Anti-infectives   Start     Dose/Rate Route Frequency Ordered Stop   03/12/12 0600  clindamycin (CLEOCIN) IVPB 900 mg     900 mg 100 mL/hr over 30 Minutes Intravenous On call to O.R. 03/11/12 1523 03/12/12 1213   03/12/12 0600  gentamicin (GARAMYCIN) 501.2 mg in dextrose 5 % 100 mL IVPB  Status:  Discontinued     5 mg/kg  100.2 kg 112.5 mL/hr over 60 Minutes Intravenous On call to O.R. 03/11/12 1523 03/11/12 1525   03/12/12 0000  gentamicin (GARAMYCIN) 380 mg in dextrose 5 % 50 mL IVPB     380 mg 119 mL/hr over 30 Minutes Intravenous 30 min pre-op 03/11/12 1527 03/12/12 1230      Assessment/Plan: s/p Procedure(s): LAPAROSCOPIC Assisted PARTIAL COLECTOMY (N/A)  Will advance diet today D/c PCA and start PO narcotics for pain Saline lock IV Pt having some heartburn symptoms, will restart protonix Possibly d/c tom if she does well  today    LOS: 3 days    THOMAS, ALICIA C. 03/15/2012

## 2012-03-16 MED ORDER — OXYCODONE-ACETAMINOPHEN 5-325 MG PO TABS: 1.0000 | ORAL_TABLET | ORAL | Status: DC | PRN

## 2012-03-16 NOTE — Discharge Summary (Signed)
Physician Discharge Summary  Patient ID: Tonya Singh MRN: [id number] DOB/AGE: 49/04/1965 46 y.o.  Admit date: 03/12/2012 Discharge date: 03/16/2012  Admission Diagnoses:  Discharge Diagnoses:  Active Problems:   Colon cancer s/p partial colectomy [id number]   Discharged Condition: good  Hospital Course: The patient did well after laparoscopic right colectomy.  She began to have bowel function on POD 3 and was advanced to a low fiber diet.  She did well with this as was able to be discharged to home on POD 4.  Consults: None  Significant Diagnostic Studies: labs: CBC, Chem  Treatments: IV hydration and analgesia: percocet  Discharge Exam: Blood pressure 135/75, pulse 53, temperature 98.2 F (36.8 C), temperature source Oral, resp. rate 16, height 5\' 6"  (1.676 m), weight 205 lb 11 oz (93.3 kg), SpO2 93.00%. General appearance: alert, cooperative and no distress GI: normal findings: soft, non-tender Incision/Wound: clean, dry, intact  Disposition: 01-Home or Self Care  Discharge Orders   Future Orders Complete By Expires     Discharge patient  As directed         Medication List    TAKE these medications       ibuprofen 200 MG tablet  Commonly known as:  ADVIL,MOTRIN  Take 400 mg by mouth every 6 (six) hours as needed for pain.     oxyCODONE-acetaminophen 5-325 MG per tablet  Commonly known as:  PERCOCET/ROXICET  Take 1-2 tablets by mouth every 4 (four) hours as needed.     pantoprazole 40 MG tablet  Commonly known as:  PROTONIX  Take 40 mg by mouth daily before breakfast.           Follow-up Information   Follow up with [facility], MD. Schedule an appointment as soon as possible for a visit in 1 week. Magnus Ivan)    Contact information:   [address] [place] [address] [phone]       Signed: Vanita Panda 03/16/2012, 9:39 AM

## 2012-03-21 ENCOUNTER — Encounter (INDEPENDENT_AMBULATORY_CARE_PROVIDER_SITE_OTHER): Payer: Self-pay

## 2012-03-21 ENCOUNTER — Ambulatory Visit (INDEPENDENT_AMBULATORY_CARE_PROVIDER_SITE_OTHER): Payer: BC Managed Care – PPO

## 2012-03-21 VITALS — BP 114/74 | HR 70 | Temp 97.0°F | Resp 16 | Ht 66.0 in | Wt 218.8 lb

## 2012-03-21 DIAGNOSIS — G8918 Other acute postprocedural pain: Secondary | ICD-10-CM

## 2012-03-21 MED ORDER — OXYCODONE-ACETAMINOPHEN 5-325 MG PO TABS: 1.0000 | ORAL_TABLET | ORAL | Status: DC | PRN

## 2012-03-21 NOTE — Progress Notes (Signed)
Patient comes in today for staple removal.  Patient s/p Lap assisted partial colectomy on 03/12/12.  Patient incision appears to be healing well.  Staples were removed, incision intact and steri-strips placed.  Patient tolerated well.  Patient requested a refill on her Oxycodone 5/325mg , reviewed with Dr. Maisie Fus and given authorization to refill (Oxycodone 5/325 mg, 1-2 q4 hours, prn pain, #20)  Patient given post op appointment with Dr. Magnus Ivan on Monday 03/31/12 @ 1:30 pm.

## 2012-03-28 ENCOUNTER — Other Ambulatory Visit (INDEPENDENT_AMBULATORY_CARE_PROVIDER_SITE_OTHER): Payer: Self-pay | Admitting: Surgery

## 2012-03-28 ENCOUNTER — Telehealth: Payer: Self-pay | Admitting: *Deleted

## 2012-03-28 ENCOUNTER — Telehealth: Payer: Self-pay | Admitting: Oncology

## 2012-03-28 DIAGNOSIS — C801 Malignant (primary) neoplasm, unspecified: Secondary | ICD-10-CM

## 2012-03-28 NOTE — Telephone Encounter (Signed)
Received oncology referral from Dr. Magnus Ivan.  This RN left voice message for patient to return call.

## 2012-03-28 NOTE — Telephone Encounter (Signed)
Spoke with patient by phone and confirmed appointment with Dr. Truett Perna for 04/14/12.  Contact names and phone numbers were provided.

## 2012-03-28 NOTE — Telephone Encounter (Signed)
PT SCHEDULED PER GINA. WELCOME PACKET MAILED.

## 2012-03-31 ENCOUNTER — Ambulatory Visit (INDEPENDENT_AMBULATORY_CARE_PROVIDER_SITE_OTHER): Payer: BC Managed Care – PPO | Admitting: Surgery

## 2012-03-31 ENCOUNTER — Encounter (INDEPENDENT_AMBULATORY_CARE_PROVIDER_SITE_OTHER): Payer: Self-pay | Admitting: Surgery

## 2012-03-31 VITALS — BP 128/66 | HR 76 | Temp 97.2°F | Resp 14 | Ht 66.5 in | Wt 215.0 lb

## 2012-03-31 DIAGNOSIS — Z09 Encounter for follow-up examination after completed treatment for conditions other than malignant neoplasm: Secondary | ICD-10-CM

## 2012-03-31 NOTE — Progress Notes (Signed)
Subjective:     Patient ID: Tonya Singh, female   DOB: Dec 21, 1965, 47 y.o.   MRN: [id number]  HPI She is here for first postop visit status post laparoscopic quadrant partial colectomy.  She is eating well and moving her bowels well. She denies fevers. Generally she feels well  Review of Systems     Objective:   Physical Exam On exam, her incisions are well healed.  The final pathology showed that the colon cancer was 6.9 cm in size. 15 lymph nodes were negative. There was focal lymphovascular invasion    Assessment:     Patient stable postop     Plan:     She will be seeing the medical oncologist next week. I will see her back in one month. She will refrain from heavy lifting for 3 more weeks

## 2012-04-10 ENCOUNTER — Encounter: Payer: Self-pay | Admitting: Oncology

## 2012-04-14 ENCOUNTER — Telehealth: Payer: Self-pay | Admitting: Oncology

## 2012-04-14 ENCOUNTER — Other Ambulatory Visit: Payer: Self-pay | Admitting: *Deleted

## 2012-04-14 ENCOUNTER — Ambulatory Visit (HOSPITAL_BASED_OUTPATIENT_CLINIC_OR_DEPARTMENT_OTHER): Payer: BC Managed Care – PPO | Admitting: Oncology

## 2012-04-14 ENCOUNTER — Ambulatory Visit (HOSPITAL_BASED_OUTPATIENT_CLINIC_OR_DEPARTMENT_OTHER): Payer: BC Managed Care – PPO

## 2012-04-14 ENCOUNTER — Encounter: Payer: Self-pay | Admitting: Oncology

## 2012-04-14 VITALS — BP 143/86 | HR 64 | Temp 97.9°F | Resp 16 | Ht 65.8 in | Wt 216.3 lb

## 2012-04-14 DIAGNOSIS — C180 Malignant neoplasm of cecum: Secondary | ICD-10-CM

## 2012-04-14 DIAGNOSIS — N83209 Unspecified ovarian cyst, unspecified side: Secondary | ICD-10-CM

## 2012-04-14 DIAGNOSIS — D509 Iron deficiency anemia, unspecified: Secondary | ICD-10-CM

## 2012-04-14 DIAGNOSIS — C189 Malignant neoplasm of colon, unspecified: Secondary | ICD-10-CM

## 2012-04-14 NOTE — Progress Notes (Signed)
[facility] New Patient Consult   Referring ZO:XWRU Tonya Singh 47 y.o.  04-02-1965    Reason for Referral: colon cancer     HPI: she developed left pelvic pain in December of 2013. She was seen in the emergency room on 12/31/2011. A CT of the abdomen and pelvisrevealed a right renal calculi. A cecal mass was noted. A left pelvic mass was felt to be potentially associated with the left ovary. A 1.5 x 3.2 cm nodular soft tissue prominence in the left pelvis was favored to reflect a regional lymph node.inflammatory changes were noted in the adjacent loops of the sigmoid colon.The CEA and CA 125 returned normal on 01/15/2012. She was referred to Dr. Leone Payor and underwent anupper endoscopy and colonoscopy on 02/12/2012.esophagitis was found in the lower third of the esophagus. The remainder the upper endoscopy was normal.a polyp was found in the sigmoid colon. A mass was noted at the cecum. Biopsies of the esophagus revealed inflamed mucosa consistent with reflux. No malignancy. The sigmoid colon polyp returned as a tubular adenoma. The cecum mass biopsy was consistent with adenocarcinoma.  She was referred to Dr. Rayburn Ma. Repeat CTs of the chest, abdomen, and pelvis on 02/14/2012 revealedno evidence of thoracic metastatic disease. The liver and adrenal glands appeared normal. The cecal mass was unchanged. A low attenuation mass in the left adnexa showed decreased attenuation compared to the previous study and was felt to represent a decreasing endometrioma or hemorrhagic ovarian cyst.  She was taken the operating roomon 03/12/2012 and underwent a laparoscopic-assisted right partial colectomy.a large mass was noted the cecum which appeared to be contained. The left ovary was consistent with a hemorrhagic ovarian cyst without evidence of malignancy.no implants or evidence of metastatic disease to the liver.  The pathology ([id number]) revealed an invasive  well-differentiated adenocarcinoma measuring 6.9 cm. Adenocarcinoma extended into the pericolonic soft tissue. Lymphovascular invasion was identified. The surgical margins were negative. No evidence of carcinoma was identified in 15 lymph nodes. The tumor returned microsatellite stable.  She reports the abdominal discomfort has resolved. Past Medical History  Diagnosis Date  . Kidney stones 2011    Right kidney stones noted on CT scans in December 2013 and February 2014  . GERD (gastroesophageal reflux disease)   . Cancer 03/12/2012    COLON CANCER(T3 N0) cecum   .   G2 P1, one abortion  .   History of Skin abscess he is in the axillary and thighs Past Surgical History  Procedure Laterality Date  . Cesarean section 1995   . Partial hysterectomy 2006 2007       . Laparoscopic partial colectomy N/A 03/12/2012    Procedure: LAPAROSCOPIC Assisted PARTIAL COLECTOMY;  Surgeon: Shelly Rubenstein, MD;  Location: [facility]S;  Service: General;  Laterality: N/A;         Family History  Problem Relation Age of Onset  . Colon polyps Father   . Breast cancer Maternal Grandmother    she is an only child  Current outpatient prescriptions:ferrous sulfate 325 (65 FE) MG tablet, Take 325 mg by mouth 2 (two) times daily with a meal., Disp: , Rfl: ;  ibuprofen (ADVIL,MOTRIN) 200 MG tablet, Take 400 mg by mouth every 6 (six) hours as needed for pain., Disp: , Rfl: ;  oxyCODONE-acetaminophen (PERCOCET/ROXICET) 5-325 MG per tablet, Take 1-2 tablets by mouth every 4 (four) hours as needed., Disp: 20 tablet, Rfl: 0 pantoprazole (PROTONIX) 40 MG tablet, Take 40 mg by mouth  daily before breakfast., Disp: , Rfl:   Allergies:  Allergies  Allergen Reactions  . Penicillins Hives and Rash    Social History: she lives in [place] with her husband. 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 She is currently unemployed.no transfusion history. No risk factor for HIV or hepatitis.    History  Alcohol Use   . Yes    Comment: occasional    History  Smoking status  . Former Smoker -- 0.50 packs/day for 25 years  . Types: Cigarettes  Smokeless tobacco  . Never Used    Comment: QUIT SMOKING ABOUT 2009     ROS:   Positives include:constipation prior to surgery, urinary urgency when drinking caffeine  A complete ROS was otherwise negative.  Physical Exam:  Blood pressure 143/86, pulse 64, temperature 97.9 F (36.6 C), temperature source Oral, resp. rate 16, height 5' 5.8" (1.671 m), weight 216 lb 4.8 oz (98.113 kg).  HEENT: oropharynx without visible mass, neck without mass Lungs: clear bilaterally Cardiac: regular rate and rhythm Abdomen: no hepatomegaly, healed surgical incisions, no mass  Vascular: no leg edema Lymph nodes: no cervical, supra-clavicular, axillary, or inguinal nodes. Small cystic lesion in the right axilla Neurologic: alert and oriented, the motor exam appears grossly intact in the upper and lower extremities Skin: no rash   LAB:  CBC  Lab Results  Component Value Date   WBC 12.9* 03/13/2012   HGB 8.5* 03/13/2012   HCT 28.4* 03/13/2012   MCV 68.1* 03/13/2012   PLT 322 03/13/2012     CMP      Component Value Date/Time   NA 134* 03/13/2012 0515   K 5.0 03/13/2012 0515   CL 103 03/13/2012 0515   CO2 24 03/13/2012 0515   GLUCOSE 127* 03/13/2012 0515   BUN 11 03/13/2012 0515   CREATININE 0.82 03/13/2012 0515   CALCIUM 8.3* 03/13/2012 0515   GFRNONAA 83* 03/13/2012 0515   GFRAA >90 03/13/2012 0515  CEA on 01/15/2012-2.7 CA 125 on 01/15/2012-14.5   Radiology:as per history of present illness    Assessment/Plan:   1. 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 Hemorrhagic left ovarian cyst  3. Microcytic anemia  4. Right kidney stones noted on the preoperative CT February 2014   Disposition:   She has been diagnosed with stage II colon cancer.I discussed the  diagnosis, prognosis, and adjuvant treatment options with Ms. Chabot. We reviewed the details of the surgical pathology report. I explained the controversy surrounding the use of adjuvant chemotherapy in patients with resected stage II colon cancer. The only adverse prognostic feature associated with her tumor is the presence of lymphovascular invasion.  She has an excellent prognosis for long-term disease-free survival. We reviewed the "adjuvant online" estimate in her case. This predicts for an approximate 86% 5 year disease-free survival in the absence of adjuvant therapy.  We discussed the predicted small absolute benefit from adjuvant 5-fluorouracil based chemotherapy and her case. I estimated a 2-4% decrease in recurrence rate with adjuvant capecitabine. We discussed the potential toxicities associated with capecitabine including the chance for nausea/vomiting, mucositis, diarrhea, and hematologic toxicity. We reviewed the chance of developing a skin rash, hyperpigmentation, and the hand/foot syndrome. She was given reading materials on capecitabine.  Ms. Basley indicated she would like to complete a course of adjuvant therapy. She will attend a chemotherapy teaching class. The plan is to begin a first cycle of adjuvant capecitabine on  04/23/2012.  She appears to have iron deficiency anemia. She will begin ferrous sulfate twice daily. We will obtain a baseline CBC and chemistry panel when she returns for the chemotherapy class later this week.  SHERRILL, GARY 04/14/2012, 8:24 PM

## 2012-04-14 NOTE — Patient Instructions (Signed)
Begin OTC ferrous sulfate 325 mg twice daily with meals to help correct anemia. May cause constipation and does discolor stools black.

## 2012-04-14 NOTE — Progress Notes (Signed)
Checked in new patient. No financial issues. °

## 2012-04-14 NOTE — Progress Notes (Signed)
Met with patient and explained role of nurse navigator.  Educational information provided to patient on colorectal cancer.  Supportive care services were discussed with patient.  She would like a dietician referral for diet education.  Resource phone numbers and contact names were provided.  Will continue to follow as needed.

## 2012-04-16 ENCOUNTER — Other Ambulatory Visit: Payer: BC Managed Care – PPO

## 2012-04-16 ENCOUNTER — Other Ambulatory Visit: Payer: Self-pay | Admitting: *Deleted

## 2012-04-16 ENCOUNTER — Encounter: Payer: Self-pay | Admitting: *Deleted

## 2012-04-16 ENCOUNTER — Ambulatory Visit: Payer: BC Managed Care – PPO | Admitting: Nutrition

## 2012-04-16 ENCOUNTER — Other Ambulatory Visit (HOSPITAL_BASED_OUTPATIENT_CLINIC_OR_DEPARTMENT_OTHER): Payer: BC Managed Care – PPO | Admitting: Lab

## 2012-04-16 DIAGNOSIS — C189 Malignant neoplasm of colon, unspecified: Secondary | ICD-10-CM

## 2012-04-16 DIAGNOSIS — C180 Malignant neoplasm of cecum: Secondary | ICD-10-CM

## 2012-04-16 LAB — COMPREHENSIVE METABOLIC PANEL (CC13)
ALT: 18 U/L (ref 0–55)
AST: 17 U/L (ref 5–34)
Albumin: 3.6 g/dL (ref 3.5–5.0)
Alkaline Phosphatase: 91 U/L (ref 40–150)
BUN: 15.3 mg/dL (ref 7.0–26.0)
CO2: 20 mEq/L — ABNORMAL LOW (ref 22–29)
Calcium: 9 mg/dL (ref 8.4–10.4)
Chloride: 109 mEq/L — ABNORMAL HIGH (ref 98–107)
Creatinine: 0.9 mg/dL (ref 0.6–1.1)
Glucose: 122 mg/dl — ABNORMAL HIGH (ref 70–99)
Potassium: 4 mEq/L (ref 3.5–5.1)
Sodium: 139 mEq/L (ref 136–145)
Total Bilirubin: 0.31 mg/dL (ref 0.20–1.20)
Total Protein: 7.2 g/dL (ref 6.4–8.3)

## 2012-04-16 LAB — CBC WITH DIFFERENTIAL/PLATELET
BASO%: 1.2 % (ref 0.0–2.0)
Basophils Absolute: 0.1 10*3/uL (ref 0.0–0.1)
EOS%: 6.9 % (ref 0.0–7.0)
Eosinophils Absolute: 0.5 10*3/uL (ref 0.0–0.5)
HCT: 33.6 % — ABNORMAL LOW (ref 34.8–46.6)
HGB: 10.3 g/dL — ABNORMAL LOW (ref 11.6–15.9)
LYMPH%: 22.6 % (ref 14.0–49.7)
MCH: 20.3 pg — ABNORMAL LOW (ref 25.1–34.0)
MCHC: 30.7 g/dL — ABNORMAL LOW (ref 31.5–36.0)
MCV: 66.1 fL — ABNORMAL LOW (ref 79.5–101.0)
MONO#: 0.5 10*3/uL (ref 0.1–0.9)
MONO%: 7.2 % (ref 0.0–14.0)
NEUT#: 4.5 10*3/uL (ref 1.5–6.5)
NEUT%: 62.1 % (ref 38.4–76.8)
Platelets: 319 10*3/uL (ref 145–400)
RBC: 5.08 10*6/uL (ref 3.70–5.45)
RDW: 19.4 % — ABNORMAL HIGH (ref 11.2–14.5)
WBC: 7.2 10*3/uL (ref 3.9–10.3)
lymph#: 1.6 10*3/uL (ref 0.9–3.3)

## 2012-04-16 NOTE — Progress Notes (Signed)
Patient is a 47 year old female diagnosed with colon cancer status post partial colectomy. She is a patient of Dr. Mancel Bale.  Past medical history includes kidney stones, GERD, and microcytic anemia.  Medications include ferrous sulfate and Protonix.  Labs include sodium 134 and glucose 127 on March 6.  Height: 66 inches. Weight: 216.3 pounds. BMI: 35.  Patient reports she is going to be taking Xeloda. She states she does not need radiation therapy. She's not having any nutrition side effects at this time but understands she could develop a poor appetite, nausea vomiting, or diarrhea.  Nutrition diagnosis: Food and nutrition related knowledge deficit related to new diagnosis of colon cancer and associated treatments as evidenced by no prior need for nutrition related information.  Intervention: Patient was educated to consume small frequent meals with adequate calories and protein to promote weight maintenance. I have briefly educated patient on potential side effects and strategies for eating if she does develop side effects. Weight loss during treatment was discouraged. All questions were answered. Fact sheets and contact information were provided. Teach back method used.  Monitoring, evaluation, goals: Patient will tolerate adequate calories and protein to promote weight maintenance throughout treatment.  Next visit: Patient will contact me if she develops problems, questions, or concerns.

## 2012-04-18 ENCOUNTER — Other Ambulatory Visit: Payer: Self-pay | Admitting: *Deleted

## 2012-04-18 ENCOUNTER — Encounter: Payer: Self-pay | Admitting: Oncology

## 2012-04-18 DIAGNOSIS — C189 Malignant neoplasm of colon, unspecified: Secondary | ICD-10-CM

## 2012-04-18 MED ORDER — CAPECITABINE 500 MG PO TABS: 2000.0000 mg | ORAL_TABLET | Freq: Two times a day (BID) | ORAL | Status: DC

## 2012-04-18 NOTE — Progress Notes (Signed)
Faxed xeloda prescription to [facility] °

## 2012-04-22 NOTE — Progress Notes (Signed)
RECEIVED A FAX FROM [facility] CONCERNING A CONFIRMATION OF PRESCRIPTION SHIPMENT FOR XELODA ON 04/21/12.

## 2012-04-28 ENCOUNTER — Encounter: Payer: Self-pay | Admitting: Pharmacist

## 2012-04-29 ENCOUNTER — Encounter (INDEPENDENT_AMBULATORY_CARE_PROVIDER_SITE_OTHER): Payer: Self-pay | Admitting: Surgery

## 2012-04-29 ENCOUNTER — Ambulatory Visit (INDEPENDENT_AMBULATORY_CARE_PROVIDER_SITE_OTHER): Payer: BC Managed Care – PPO | Admitting: Surgery

## 2012-04-29 VITALS — BP 118/65 | HR 74 | Temp 98.2°F | Resp 16 | Ht 66.0 in | Wt 224.4 lb

## 2012-04-29 DIAGNOSIS — Z09 Encounter for follow-up examination after completed treatment for conditions other than malignant neoplasm: Secondary | ICD-10-CM

## 2012-04-29 MED ORDER — NYSTATIN 100000 UNIT/ML MT SUSP: 500000.0000 [IU] | Freq: Four times a day (QID) | OROMUCOSAL | Status: DC

## 2012-04-29 NOTE — Progress Notes (Signed)
Subjective:     Patient ID: Tonya Singh, female   DOB: 1965/02/18, 47 y.o.   MRN: [id number]  HPI She is here for another postoperative visit status post laparoscopic assisted right partial colectomy for a stage II colon cancer. She has since seen Dr. Alcide Evener and has decided to proceed with adjuvant chemotherapy.  Otherwise she is doing well. She is eating well and moving her bowels well. She is complaining of thrush Review of Systems     Objective:   Physical Exam On exam, her incision is well-healed without evidence of hernia   She does have thrush on exam Assessment:     Patient stable post op     Plan:     She will continue her current care at the cancer center. I will see her back in 6 months for recheck. She may now resume normal activity .  I will write her for nystatin swish and swallow

## 2012-05-09 ENCOUNTER — Telehealth: Payer: Self-pay | Admitting: Oncology

## 2012-05-09 ENCOUNTER — Other Ambulatory Visit: Payer: Self-pay | Admitting: *Deleted

## 2012-05-09 ENCOUNTER — Ambulatory Visit (HOSPITAL_BASED_OUTPATIENT_CLINIC_OR_DEPARTMENT_OTHER): Payer: BC Managed Care – PPO | Admitting: Oncology

## 2012-05-09 ENCOUNTER — Ambulatory Visit (HOSPITAL_BASED_OUTPATIENT_CLINIC_OR_DEPARTMENT_OTHER): Payer: BC Managed Care – PPO | Admitting: Lab

## 2012-05-09 VITALS — BP 154/85 | HR 55 | Temp 98.2°F | Resp 18 | Ht 66.0 in | Wt 223.9 lb

## 2012-05-09 DIAGNOSIS — C180 Malignant neoplasm of cecum: Secondary | ICD-10-CM

## 2012-05-09 DIAGNOSIS — R197 Diarrhea, unspecified: Secondary | ICD-10-CM

## 2012-05-09 DIAGNOSIS — C189 Malignant neoplasm of colon, unspecified: Secondary | ICD-10-CM

## 2012-05-09 DIAGNOSIS — N83209 Unspecified ovarian cyst, unspecified side: Secondary | ICD-10-CM

## 2012-05-09 DIAGNOSIS — D509 Iron deficiency anemia, unspecified: Secondary | ICD-10-CM

## 2012-05-09 LAB — CBC WITH DIFFERENTIAL/PLATELET
BASO%: 0.4 % (ref 0.0–2.0)
Basophils Absolute: 0 10*3/uL (ref 0.0–0.1)
EOS%: 2.6 % (ref 0.0–7.0)
Eosinophils Absolute: 0.3 10*3/uL (ref 0.0–0.5)
HCT: 37.2 % (ref 34.8–46.6)
HGB: 11.5 g/dL — ABNORMAL LOW (ref 11.6–15.9)
LYMPH%: 18.4 % (ref 14.0–49.7)
MCH: 21.6 pg — ABNORMAL LOW (ref 25.1–34.0)
MCHC: 31 g/dL — ABNORMAL LOW (ref 31.5–36.0)
MCV: 69.5 fL — ABNORMAL LOW (ref 79.5–101.0)
MONO#: 0.6 10*3/uL (ref 0.1–0.9)
MONO%: 6.1 % (ref 0.0–14.0)
NEUT#: 7.6 10*3/uL — ABNORMAL HIGH (ref 1.5–6.5)
NEUT%: 72.5 % (ref 38.4–76.8)
Platelets: 387 10*3/uL (ref 145–400)
RBC: 5.35 10*6/uL (ref 3.70–5.45)
RDW: 21.7 % — ABNORMAL HIGH (ref 11.2–14.5)
WBC: 10.5 10*3/uL — ABNORMAL HIGH (ref 3.9–10.3)
lymph#: 1.9 10*3/uL (ref 0.9–3.3)

## 2012-05-09 MED ORDER — CAPECITABINE 500 MG PO TABS: 2000.0000 mg | ORAL_TABLET | Freq: Two times a day (BID) | ORAL | Status: DC

## 2012-05-09 MED ORDER — PROCHLORPERAZINE MALEATE 10 MG PO TABS: 10.0000 mg | ORAL_TABLET | Freq: Four times a day (QID) | ORAL | Status: DC | PRN

## 2012-05-09 NOTE — Telephone Encounter (Signed)
gv and printed appt sched and avs for May for pt. °

## 2012-05-09 NOTE — Telephone Encounter (Signed)
Left VM for patient that [facility] allowed to call in 1 month supply of Xeloda which is for 1 1/2 cycle of medication. She will need to call to reorder when she starts her 2nd cycle of med of the month. Do not begin that cycle until counts are checked and OK to start per MD.

## 2012-05-09 NOTE — Progress Notes (Signed)
   [facility]    OFFICE PROGRESS NOTE   INTERVAL HISTORY:   She returns as scheduled. She began a first cycle of adjuvant Xeloda on 04/23/2012. She reports mouth sores were relieved with a nystatin rinse. No hand or foot pain. She now has 2-4 loose stools per day. This started about one week into the Xeloda. She has nausea this morning. Good appetite.  Objective:  Vital signs in last 24 hours:  Blood pressure 154/85, pulse 55, temperature 98.2 F (36.8 C), temperature source Oral, resp. rate 18, height 5\' 6"  (1.676 m), weight 223 lb 14.4 oz (101.56 kg).    HEENT: No thrush or ulcers Resp: Lungs clear bilaterally Cardio: Regular rate and rhythm GI: No hepatomegaly, nontender Vascular: No leg edema  Skin: Hands and feet without erythema or skin breakdown     Lab Results:  Lab Results  Component Value Date   WBC 7.2 04/16/2012   HGB 10.3* 04/16/2012   HCT 33.6* 04/16/2012   MCV 66.1* 04/16/2012   PLT 319 04/16/2012   CBC pending today    Medications: I have reviewed the patient's current medications.  Assessment/Plan: 1. 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 She began a first cycle of adjuvant Xeloda on 04/23/2012 2. Hemorrhagic left ovarian cyst  3. Microcytic anemia -now taking iron, we will followup on the CBC from today 4. Right kidney stones noted on the preoperative CT February 2014  5. Diarrhea-? Related to colon surgery versus Xeloda, she will try Imodium  Disposition:  She has completed one cycle of adjuvant Xeloda. The plan is to proceed with cycle 2 on 05/14/2012. She will contact us if the diarrhea has not improved prior to 05/14/2012. She will return for an office visit on 05/30/2012. She had an episode of nausea this morning,? Etiology. We gave her a prescription for Compazine to use as needed.   Thornton Papas, MD  05/09/2012  11:59 AM

## 2012-05-12 NOTE — Telephone Encounter (Signed)
RECEIVED A FAX FROM [facility] CONCERNING A CONFIRMATION OF PRESCRIPTION SHIPMENT FOR XELODA ON 05/09/12.

## 2012-05-22 ENCOUNTER — Telehealth: Payer: Self-pay | Admitting: *Deleted

## 2012-05-22 NOTE — Telephone Encounter (Signed)
Asking if OK to swim in pool or lake or go to [facility] and work out while on Xeloda? Informed her yes, be sure to use wipes between equipment and pool is one that is inspected and has appropriate chlorine levels. Wear a lot of sunscreen and apply frequently due to photosensitivity with chemo. Be sure any lake swimming is in a safe, clean lake.

## 2012-05-30 ENCOUNTER — Telehealth: Payer: Self-pay | Admitting: Oncology

## 2012-05-30 ENCOUNTER — Ambulatory Visit (HOSPITAL_BASED_OUTPATIENT_CLINIC_OR_DEPARTMENT_OTHER): Payer: BC Managed Care – PPO | Admitting: Oncology

## 2012-05-30 ENCOUNTER — Other Ambulatory Visit (HOSPITAL_BASED_OUTPATIENT_CLINIC_OR_DEPARTMENT_OTHER): Payer: BC Managed Care – PPO | Admitting: Lab

## 2012-05-30 VITALS — BP 169/93 | HR 60 | Temp 97.1°F | Resp 18 | Ht 66.0 in | Wt 222.1 lb

## 2012-05-30 DIAGNOSIS — N951 Menopausal and female climacteric states: Secondary | ICD-10-CM

## 2012-05-30 DIAGNOSIS — C189 Malignant neoplasm of colon, unspecified: Secondary | ICD-10-CM

## 2012-05-30 DIAGNOSIS — R232 Flushing: Secondary | ICD-10-CM

## 2012-05-30 LAB — COMPREHENSIVE METABOLIC PANEL (CC13)
ALT: 12 U/L (ref 0–55)
AST: 16 U/L (ref 5–34)
Albumin: 3.9 g/dL (ref 3.5–5.0)
Alkaline Phosphatase: 84 U/L (ref 40–150)
BUN: 10.6 mg/dL (ref 7.0–26.0)
CO2: 22 mEq/L (ref 22–29)
Calcium: 9.2 mg/dL (ref 8.4–10.4)
Chloride: 109 mEq/L — ABNORMAL HIGH (ref 98–107)
Creatinine: 1 mg/dL (ref 0.6–1.1)
Glucose: 103 mg/dl — ABNORMAL HIGH (ref 70–99)
Potassium: 3.9 mEq/L (ref 3.5–5.1)
Sodium: 140 mEq/L (ref 136–145)
Total Bilirubin: 0.5 mg/dL (ref 0.20–1.20)
Total Protein: 7.1 g/dL (ref 6.4–8.3)

## 2012-05-30 LAB — CBC WITH DIFFERENTIAL/PLATELET
BASO%: 1.2 % (ref 0.0–2.0)
Basophils Absolute: 0.1 10*3/uL (ref 0.0–0.1)
EOS%: 5.4 % (ref 0.0–7.0)
Eosinophils Absolute: 0.3 10*3/uL (ref 0.0–0.5)
HCT: 36.7 % (ref 34.8–46.6)
HGB: 11.7 g/dL (ref 11.6–15.9)
LYMPH%: 32.4 % (ref 14.0–49.7)
MCH: 24 pg — ABNORMAL LOW (ref 25.1–34.0)
MCHC: 31.8 g/dL (ref 31.5–36.0)
MCV: 75.3 fL — ABNORMAL LOW (ref 79.5–101.0)
MONO#: 0.5 10*3/uL (ref 0.1–0.9)
MONO%: 8.6 % (ref 0.0–14.0)
NEUT#: 3.1 10*3/uL (ref 1.5–6.5)
NEUT%: 52.4 % (ref 38.4–76.8)
Platelets: 323 10*3/uL (ref 145–400)
RBC: 4.87 10*6/uL (ref 3.70–5.45)
RDW: 31.3 % — ABNORMAL HIGH (ref 11.2–14.5)
WBC: 5.9 10*3/uL (ref 3.9–10.3)
lymph#: 1.9 10*3/uL (ref 0.9–3.3)

## 2012-05-30 NOTE — Telephone Encounter (Signed)
Gave pt appt for June 2014 lab and MD °

## 2012-05-30 NOTE — Progress Notes (Signed)
   [facility]    OFFICE PROGRESS NOTE   INTERVAL HISTORY:   She returns as scheduled. She began a second cycle of Xeloda on 05/14/2012. A few mouth ulcers have resolved. She had discomfort at the hands and feet for one or 2 days. This has resolved. She has noted skin thickening over the hands. She continues to have 4-5 bowel movements per day. She believes taking iron has helped with the stool frequency. She has noted emotional lability and intermittent hot flashes.  Objective:  Vital signs in last 24 hours:  Blood pressure 169/93, pulse 60, temperature 97.1 F (36.2 C), temperature source Oral, resp. rate 18, height 5\' 6"  (1.676 m), weight 222 lb 1.6 oz (100.744 kg).    HEENT: No thrush or ulcer Resp: Lungs clear bilaterally Cardio: Regular rate and rhythm GI: No hepatomegaly, nontender Vascular: No leg edema  Skin: Hands and feet without erythema. Mild skin thickening at several fingers , faint erythematous rash at the right pretibial area, upper chest, and lower arms  Lab Results:  Lab Results  Component Value Date   WBC 5.9 05/30/2012   HGB 11.7 05/30/2012   HCT 36.7 05/30/2012   MCV 75.3* 05/30/2012   PLT 323 05/30/2012   ANC 3.1    Medications: I have reviewed the patient's current medications.  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 She began a first cycle of adjuvant Xeloda on 04/23/2012  2. Hemorrhagic left ovarian cyst  3. Microcytic anemia -now taking iron, improved 4. Right kidney stones noted on the preoperative CT February 2014  5. Diarrhea/frequent bowel movements-likely related to surgery, improved since starting iron 6. Hot flashes and emotional swings-? Menopause. We will check an [facility] and estrogen level today. She plans to schedule a GYN appointment. 7. Mild erythematous rash at the right leg, upper chest, and distal  arm-likely secondary to Xeloda, I cautioned her regarding sun sensitivity 8. Hypertension-I recommended she followup with her primary physician  Disposition:  Ms. Sachdev has completed 2 cycles of Xeloda. She appears to be tolerating the Xeloda well. The plan is to proceed with cycle 3 on 06/04/2012. She will return for an office and lab visit on 06/24/2012   Thornton Papas, MD  05/30/2012  12:33 PM

## 2012-05-30 NOTE — Progress Notes (Signed)
Unable to add [facility] to today's lab sample per lab. Will draw at next visit.

## 2012-06-24 ENCOUNTER — Ambulatory Visit (HOSPITAL_BASED_OUTPATIENT_CLINIC_OR_DEPARTMENT_OTHER): Payer: BC Managed Care – PPO | Admitting: Oncology

## 2012-06-24 ENCOUNTER — Other Ambulatory Visit (HOSPITAL_BASED_OUTPATIENT_CLINIC_OR_DEPARTMENT_OTHER): Payer: BC Managed Care – PPO | Admitting: Lab

## 2012-06-24 ENCOUNTER — Telehealth: Payer: Self-pay | Admitting: Oncology

## 2012-06-24 VITALS — BP 163/87 | HR 58 | Temp 98.4°F | Resp 18 | Ht 66.0 in | Wt 225.4 lb

## 2012-06-24 DIAGNOSIS — C189 Malignant neoplasm of colon, unspecified: Secondary | ICD-10-CM

## 2012-06-24 DIAGNOSIS — N951 Menopausal and female climacteric states: Secondary | ICD-10-CM

## 2012-06-24 DIAGNOSIS — R232 Flushing: Secondary | ICD-10-CM

## 2012-06-24 LAB — CBC WITH DIFFERENTIAL/PLATELET
BASO%: 0.8 % (ref 0.0–2.0)
Basophils Absolute: 0.1 10*3/uL (ref 0.0–0.1)
EOS%: 4.1 % (ref 0.0–7.0)
Eosinophils Absolute: 0.3 10*3/uL (ref 0.0–0.5)
HCT: 38.9 % (ref 34.8–46.6)
HGB: 12.8 g/dL (ref 11.6–15.9)
LYMPH%: 25.2 % (ref 14.0–49.7)
MCH: 26.6 pg (ref 25.1–34.0)
MCHC: 32.9 g/dL (ref 31.5–36.0)
MCV: 80.9 fL (ref 79.5–101.0)
MONO#: 0.4 10*3/uL (ref 0.1–0.9)
MONO%: 5.7 % (ref 0.0–14.0)
NEUT#: 4.5 10*3/uL (ref 1.5–6.5)
NEUT%: 64.2 % (ref 38.4–76.8)
Platelets: 263 10*3/uL (ref 145–400)
RBC: 4.81 10*6/uL (ref 3.70–5.45)
RDW: 36.1 % — ABNORMAL HIGH (ref 11.2–14.5)
WBC: 7.1 10*3/uL (ref 3.9–10.3)
lymph#: 1.8 10*3/uL (ref 0.9–3.3)

## 2012-06-24 LAB — ESTRADIOL: Estradiol: 60 pg/mL

## 2012-06-24 LAB — FOLLICLE STIMULATING HORMONE: FSH: 6 m[IU]/mL

## 2012-06-24 NOTE — Progress Notes (Signed)
   [facility]    OFFICE PROGRESS NOTE   INTERVAL HISTORY:   She returns as scheduled. She completed another cycle of Xeloda beginning on 06/04/2012. She reports less diarrhea following this cycle. No mouth sores or hand/foot pain. She has several "skin lesions" removed by her dermatologist including a lesion at the right scalp, right thigh, and left great toe. 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 The dermatologist plans and additional excision procedure.  Objective:  Vital signs in last 24 hours:  Blood pressure 163/87, pulse 58, temperature 98.4 F (36.9 C), temperature source Oral, resp. rate 18, height 5\' 6"  (1.676 m), weight 225 lb 6.4 oz (102.241 kg).    HEENT: No thrush or ulcers Resp: Lungs clear bilaterally Cardio: Regular rate and rhythm GI: No hepatomegaly, nontender Vascular: No leg edema  Skin: Healing biopsy sites at the right frontal scalp, right thigh, and left great toe. Minimal surrounding induration at the right thigh and left toe   Lab Results:  Lab Results  Component Value Date   WBC 7.1 06/24/2012   HGB 12.8 06/24/2012   HCT 38.9 06/24/2012   MCV 80.9 06/24/2012   PLT 263 06/24/2012      Medications: I have reviewed the patient's current medications.  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 She began a first cycle of adjuvant Xeloda on 04/23/2012  2. Hemorrhagic left ovarian cyst  3. Microcytic anemia -now taking iron, improved  4. Right kidney stones noted on the preoperative CT February 2014  5. Diarrhea/frequent bowel movements-likely related to surgery, improved since starting iron  6. Hot flashes and emotional swings-? Menopause. She has been seen by gynecology. We checked an estrogen level and FSH today the 7. status post multiple skin biopsies  Disposition:  She has completed 3 cycles of Xeloda. She appears  to be tolerating the chemotherapy well. The plan is to proceed with cycle 4 on 06/25/2012. She will return for an office visit in 3 weeks. I recommended she asked her dermatologist to hold on further skin biopsies until the completion of chemotherapy. She will contact us if the skin biopsy sites are not healing or appear infected.   Thornton Papas, MD  06/24/2012  11:34 AM

## 2012-06-26 ENCOUNTER — Telehealth: Payer: Self-pay | Admitting: *Deleted

## 2012-06-26 NOTE — Telephone Encounter (Signed)
Pt notified estrogen / FSH are not consistent with menopause.  Pt verbalized understanding and expressed appreciation for call.

## 2012-06-26 NOTE — Telephone Encounter (Signed)
Message copied by Gala Romney on Thu Jun 26, 2012  2:55 PM ------      Message from: Ladene Artist      Created: Tue Jun 24, 2012  7:31 PM       Please call patient, estrogen and FSH are not consistent with menopause ------

## 2012-07-03 NOTE — Telephone Encounter (Signed)
[facility] sent facsimile confirmation of Capecitabine prescription shipment.  Was shipped 06-30-2012 with next business day delivery.

## 2012-07-13 ENCOUNTER — Other Ambulatory Visit: Payer: Self-pay | Admitting: Oncology

## 2012-07-15 ENCOUNTER — Ambulatory Visit (HOSPITAL_BASED_OUTPATIENT_CLINIC_OR_DEPARTMENT_OTHER): Payer: BC Managed Care – PPO | Admitting: Oncology

## 2012-07-15 ENCOUNTER — Other Ambulatory Visit (HOSPITAL_BASED_OUTPATIENT_CLINIC_OR_DEPARTMENT_OTHER): Payer: BC Managed Care – PPO

## 2012-07-15 ENCOUNTER — Telehealth: Payer: Self-pay | Admitting: Oncology

## 2012-07-15 VITALS — BP 173/90 | HR 55 | Temp 97.5°F | Resp 18 | Ht 66.0 in | Wt 226.9 lb

## 2012-07-15 DIAGNOSIS — C189 Malignant neoplasm of colon, unspecified: Secondary | ICD-10-CM

## 2012-07-15 LAB — CBC WITH DIFFERENTIAL/PLATELET
BASO%: 0.3 % (ref 0.0–2.0)
Basophils Absolute: 0 10*3/uL (ref 0.0–0.1)
EOS%: 5.8 % (ref 0.0–7.0)
Eosinophils Absolute: 0.3 10*3/uL (ref 0.0–0.5)
HCT: 39.9 % (ref 34.8–46.6)
HGB: 12.9 g/dL (ref 11.6–15.9)
LYMPH%: 30.9 % (ref 14.0–49.7)
MCH: 27.6 pg (ref 25.1–34.0)
MCHC: 32.3 g/dL (ref 31.5–36.0)
MCV: 85.4 fL (ref 79.5–101.0)
MONO#: 0.4 10*3/uL (ref 0.1–0.9)
MONO%: 7.5 % (ref 0.0–14.0)
NEUT#: 3.2 10*3/uL (ref 1.5–6.5)
NEUT%: 55.5 % (ref 38.4–76.8)
Platelets: 238 10*3/uL (ref 145–400)
RBC: 4.67 10*6/uL (ref 3.70–5.45)
RDW: 29.5 % — ABNORMAL HIGH (ref 11.2–14.5)
WBC: 5.9 10*3/uL (ref 3.9–10.3)
lymph#: 1.8 10*3/uL (ref 0.9–3.3)
nRBC: 0 % (ref 0–0)

## 2012-07-15 NOTE — Telephone Encounter (Signed)
gv and printed appt sched and avs for pt  °

## 2012-07-15 NOTE — Progress Notes (Signed)
   [facility]    OFFICE PROGRESS NOTE   INTERVAL HISTORY:   She returns as scheduled. She completed another cycle of Xeloda beginning on 06/25/2012. No mouth sores, nausea, or diarrhea. She continues to have 3-4 bowel movements per day. Stiffness in the hands, but no pain over the hands or feet. She continues to have hot flashes in the evening.  Objective:  Vital signs in last 24 hours:  Blood pressure 173/90, pulse 55, temperature 97.5 F (36.4 C), temperature source Oral, resp. rate 18, height 5\' 6"  (1.676 m), weight 226 lb 14.4 oz (102.921 kg).    HEENT: No thrush or ulcers Resp: Lungs clear bilaterally Cardio: Regular rate and rhythm GI: No hepatomegaly, nontender Vascular: No leg edema  Skin: Mild hyperpigmentation of the palms and soles without skin breakdown. Healed biopsy sites at the right frontal scalp and left great toe.     Lab Results:  Lab Results  Component Value Date   WBC 5.9 07/15/2012   HGB 12.9 07/15/2012   HCT 39.9 07/15/2012   MCV 85.4 07/15/2012   PLT 238 07/15/2012   ANC 3.2  06/24/2012-FSH 6.0, estradiol 60    Medications: I have reviewed the patient's current medications.  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 She began a first cycle of adjuvant Xeloda on 04/23/2012  2. Hemorrhagic left ovarian cyst  3. Microcytic anemia -now taking iron, improved  4. Right kidney stones noted on the preoperative CT February 2014  5. Diarrhea/frequent bowel movements-likely related to surgery 6. Hot flashes and emotional swings-? Menopause. She has been seen by gynecology. The estrogen and FSH levels were not in the postmenopausal range on 06/24/2012  7. status post multiple skin biopsies - she will followup with her dermatologist for repeat excision at the right toe biopsy site as recommended 8. Hypertension-I recommended she see  her primary physician for blood pressure check    Disposition:  Ms. Camberos has completed 4 cycles of adjuvant Xeloda. She is tolerating the Xeloda well. The plan is to proceed with cycle 5 on 07/16/2012. She will return for an office visit on 08/05/2012.   Thornton Papas, MD  07/15/2012  11:33 AM

## 2012-08-01 ENCOUNTER — Encounter: Payer: Self-pay | Admitting: *Deleted

## 2012-08-01 NOTE — Progress Notes (Signed)
RECEIVED A FAX FROM [facility] CONCERNING A CONFIRMATION OF PRESCRIPTION SHIPMENT FOR CAPECITABINE ON 07/31/12. 

## 2012-08-05 ENCOUNTER — Ambulatory Visit (HOSPITAL_BASED_OUTPATIENT_CLINIC_OR_DEPARTMENT_OTHER): Payer: BC Managed Care – PPO | Admitting: Nurse Practitioner

## 2012-08-05 ENCOUNTER — Other Ambulatory Visit (HOSPITAL_BASED_OUTPATIENT_CLINIC_OR_DEPARTMENT_OTHER): Payer: BC Managed Care – PPO | Admitting: Lab

## 2012-08-05 VITALS — BP 163/85 | HR 54 | Temp 98.8°F | Resp 18 | Ht 66.0 in | Wt 228.3 lb

## 2012-08-05 DIAGNOSIS — C189 Malignant neoplasm of colon, unspecified: Secondary | ICD-10-CM

## 2012-08-05 LAB — CBC WITH DIFFERENTIAL/PLATELET
BASO%: 0.6 % (ref 0.0–2.0)
Basophils Absolute: 0 10*3/uL (ref 0.0–0.1)
EOS%: 3.9 % (ref 0.0–7.0)
Eosinophils Absolute: 0.3 10*3/uL (ref 0.0–0.5)
HCT: 40.6 % (ref 34.8–46.6)
HGB: 13.5 g/dL (ref 11.6–15.9)
LYMPH%: 22 % (ref 14.0–49.7)
MCH: 30.1 pg (ref 25.1–34.0)
MCHC: 33.3 g/dL (ref 31.5–36.0)
MCV: 90.3 fL (ref 79.5–101.0)
MONO#: 0.6 10*3/uL (ref 0.1–0.9)
MONO%: 7.6 % (ref 0.0–14.0)
NEUT#: 5 10*3/uL (ref 1.5–6.5)
NEUT%: 65.9 % (ref 38.4–76.8)
Platelets: 248 10*3/uL (ref 145–400)
RBC: 4.49 10*6/uL (ref 3.70–5.45)
RDW: 30.6 % — ABNORMAL HIGH (ref 11.2–14.5)
WBC: 7.6 10*3/uL (ref 3.9–10.3)
lymph#: 1.7 10*3/uL (ref 0.9–3.3)

## 2012-08-05 LAB — COMPREHENSIVE METABOLIC PANEL (CC13)
ALT: 15 U/L (ref 0–55)
AST: 17 U/L (ref 5–34)
Albumin: 3.7 g/dL (ref 3.5–5.0)
Alkaline Phosphatase: 81 U/L (ref 40–150)
BUN: 13.1 mg/dL (ref 7.0–26.0)
CO2: 21 mEq/L — ABNORMAL LOW (ref 22–29)
Calcium: 9.2 mg/dL (ref 8.4–10.4)
Chloride: 108 mEq/L (ref 98–109)
Creatinine: 1 mg/dL (ref 0.6–1.1)
Glucose: 97 mg/dl (ref 70–140)
Potassium: 4.2 mEq/L (ref 3.5–5.1)
Sodium: 141 mEq/L (ref 136–145)
Total Bilirubin: 0.45 mg/dL (ref 0.20–1.20)
Total Protein: 7.2 g/dL (ref 6.4–8.3)

## 2012-08-05 NOTE — Progress Notes (Signed)
OFFICE PROGRESS NOTE  Interval history:  Tonya Singh returns as scheduled. She completed cycle 5 adjuvant Xeloda beginning 07/16/2012. During the second week she developed pain and dryness over the palms and soles. She continues to note pain at the heels with ambulation. She denies any nausea, vomiting or diarrhea. No mouth sores.   Objective: Blood pressure 163/85, pulse 54, temperature 98.8 F (37.1 C), temperature source Oral, resp. rate 18, height 5\' 6"  (1.676 m), weight 228 lb 4.8 oz (103.556 kg).  Thrush or ulcerations. Lungs clear. Regular cardiac rhythm. Abdomen is soft and nontender. No hepatomegaly. Question midline hernia. Extremities without edema. Palms and soles are erythematous. Heels are very dry appearing. No skin breakdown over the palms.  Lab Results: Lab Results  Component Value Date   WBC 7.6 08/05/2012   HGB 13.5 08/05/2012   HCT 40.6 08/05/2012   MCV 90.3 08/05/2012   PLT 248 08/05/2012    Chemistry:    Chemistry      Component Value Date/Time   NA 141 08/05/2012 0945   NA 134* 03/13/2012 0515   K 4.2 08/05/2012 0945   K 5.0 03/13/2012 0515   CL 109* 05/30/2012 1103   CL 103 03/13/2012 0515   CO2 21* 08/05/2012 0945   CO2 24 03/13/2012 0515   BUN 13.1 08/05/2012 0945   BUN 11 03/13/2012 0515   CREATININE 1.0 08/05/2012 0945   CREATININE 0.82 03/13/2012 0515      Component Value Date/Time   CALCIUM 9.2 08/05/2012 0945   CALCIUM 8.3* 03/13/2012 0515   ALKPHOS 81 08/05/2012 0945   AST 17 08/05/2012 0945   ALT 15 08/05/2012 0945   BILITOT 0.45 08/05/2012 0945       Studies/Results: No results found.  Medications: I have reviewed the patient's current medications.  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 She began a first cycle of adjuvant Xeloda on 04/23/2012.  2. Hemorrhagic left ovarian cyst  3. Microcytic anemia -now taking iron, improved  4. Right  kidney stones noted on the preoperative CT February 2014  5. Diarrhea/frequent bowel movements-likely related to surgery  6. Hot flashes and emotional swings-? Menopause. She has been seen by gynecology. The estrogen and FSH levels were not in the postmenopausal range on 06/24/2012  7. status post multiple skin biopsies - she will followup with her dermatologist for repeat excision at the right toe biopsy site as recommended  8. Hypertension. She will followup with her primary physician. 9. Hand-foot syndrome secondary to Xeloda.  Disposition-she has completed 5 cycles of adjuvant Xeloda. She has developed hand-foot syndrome with redness and pain. She is scheduled to begin cycle 6 on 08/06/2012. I recommended she delay cycle 6 for one week (begin 08/13/2012). She is in agreement. She will return for a followup visit on 08/26/2012. She understands to stop the Xeloda and contact the office with progressive hand/foot symptoms during cycle 6.    Lonna Cobb ANP/GNP-BC

## 2012-08-26 ENCOUNTER — Ambulatory Visit (HOSPITAL_BASED_OUTPATIENT_CLINIC_OR_DEPARTMENT_OTHER): Payer: PRIVATE HEALTH INSURANCE | Admitting: Oncology

## 2012-08-26 ENCOUNTER — Telehealth: Payer: Self-pay

## 2012-08-26 ENCOUNTER — Other Ambulatory Visit: Payer: Self-pay | Admitting: *Deleted

## 2012-08-26 ENCOUNTER — Other Ambulatory Visit (HOSPITAL_BASED_OUTPATIENT_CLINIC_OR_DEPARTMENT_OTHER): Payer: PRIVATE HEALTH INSURANCE | Admitting: Lab

## 2012-08-26 VITALS — BP 139/81 | HR 64 | Temp 97.6°F | Resp 18 | Ht 66.0 in | Wt 231.6 lb

## 2012-08-26 DIAGNOSIS — C189 Malignant neoplasm of colon, unspecified: Secondary | ICD-10-CM

## 2012-08-26 DIAGNOSIS — C180 Malignant neoplasm of cecum: Secondary | ICD-10-CM

## 2012-08-26 LAB — CBC WITH DIFFERENTIAL/PLATELET
BASO%: 0.9 % (ref 0.0–2.0)
Basophils Absolute: 0.1 10*3/uL (ref 0.0–0.1)
EOS%: 4.8 % (ref 0.0–7.0)
Eosinophils Absolute: 0.4 10*3/uL (ref 0.0–0.5)
HCT: 41.9 % (ref 34.8–46.6)
HGB: 14 g/dL (ref 11.6–15.9)
LYMPH%: 21.6 % (ref 14.0–49.7)
MCH: 31 pg (ref 25.1–34.0)
MCHC: 33.4 g/dL (ref 31.5–36.0)
MCV: 92.9 fL (ref 79.5–101.0)
MONO#: 0.4 10*3/uL (ref 0.1–0.9)
MONO%: 5.3 % (ref 0.0–14.0)
NEUT#: 5.2 10*3/uL (ref 1.5–6.5)
NEUT%: 67.4 % (ref 38.4–76.8)
Platelets: 293 10*3/uL (ref 145–400)
RBC: 4.51 10*6/uL (ref 3.70–5.45)
RDW: 23.1 % — ABNORMAL HIGH (ref 11.2–14.5)
WBC: 7.6 10*3/uL (ref 3.9–10.3)
lymph#: 1.7 10*3/uL (ref 0.9–3.3)

## 2012-08-26 MED ORDER — CAPECITABINE 500 MG PO TABS: 2000.0000 mg | ORAL_TABLET | Freq: Two times a day (BID) | ORAL | Status: DC

## 2012-08-26 NOTE — Addendum Note (Signed)
Addended by: Arvilla Meres on: 08/26/2012 05:02 PM   Modules accepted: Orders

## 2012-08-26 NOTE — Telephone Encounter (Signed)
gv and printed appt sched and avs for pt  °

## 2012-08-26 NOTE — Telephone Encounter (Signed)
THIS REFILL REQUEST FOR CAPECITABINE WAS GIVEN TO DR.SHERRILL'S NURSE, TANYA WHITLOCK,RN. 

## 2012-08-26 NOTE — Progress Notes (Signed)
   [facility]    OFFICE PROGRESS NOTE   INTERVAL HISTORY:   She returns as scheduled. Cycle 6 of Xeloda was delayed secondary to hand/foot syndrome. She began cycle 6 on 08/13/2012. She tolerated the chemotherapy well. No hand/foot pain today. She has developed a "hernia ".  Objective:  Vital signs in last 24 hours:  Blood pressure 139/81, pulse 64, temperature 97.6 F (36.4 C), temperature source Oral, resp. rate 18, height 5\' 6"  (1.676 m), weight 231 lb 9.6 oz (105.053 kg).    HEENT: No thrush or ulcers Resp: Lungs clear bilaterally Cardio: Regular rate and rhythm GI: Reducible ventral hernia, no hepatomegaly, nontender Vascular: No leg edema  Skin: Mild erythema at the lower legs, erythema at the palms and soles with callus formation over the soles. No skin breakdown    Lab Results:  Lab Results  Component Value Date   WBC 7.6 08/26/2012   HGB 14.0 08/26/2012   HCT 41.9 08/26/2012   MCV 92.9 08/26/2012   PLT 293 08/26/2012   ANC 5.2    Medications: I have reviewed the patient's current medications.  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 She began a first cycle of adjuvant Xeloda on 04/23/2012.  2. Hemorrhagic left ovarian cyst  3. Microcytic anemia -resolved, she will discontinue iron 4. Right kidney stones noted on the preoperative CT February 2014  5. Diarrhea/frequent bowel movements-likely related to surgery  6. Hot flashes and emotional swings-? Menopause. She has been seen by gynecology. The estrogen and FSH levels were not in the postmenopausal range on 06/24/2012  7. status post multiple skin biopsies - she will followup with her dermatologist for repeat excision at the right toe biopsy site as recommended  8. Hypertension. She will followup with her primary physician.  9. Hand-foot syndrome secondary to Xeloda. Improved  today   Disposition:  The hand/foot syndrome appears improved today. She has completed 6 cycles of Xeloda. The plan is to proceed with cycle 7 on 09/03/2012. She will return for an office visit on 09/22/2012. She will discontinue Xeloda and contact us for progressive hand/foot symptoms.   Thornton Papas, MD  08/26/2012  1:17 PM

## 2012-09-01 NOTE — Telephone Encounter (Signed)
RECEIVED A FAX FROM [facility] CONCERNING A CONFIRMATION OF PRESCRIPTION SHIPMENT FOR CAPECITABINE ON 08/29/12. 

## 2012-09-22 ENCOUNTER — Ambulatory Visit (HOSPITAL_BASED_OUTPATIENT_CLINIC_OR_DEPARTMENT_OTHER): Payer: PRIVATE HEALTH INSURANCE | Admitting: Oncology

## 2012-09-22 ENCOUNTER — Other Ambulatory Visit (HOSPITAL_BASED_OUTPATIENT_CLINIC_OR_DEPARTMENT_OTHER): Payer: PRIVATE HEALTH INSURANCE | Admitting: Lab

## 2012-09-22 ENCOUNTER — Telehealth: Payer: Self-pay | Admitting: Oncology

## 2012-09-22 VITALS — BP 174/97 | HR 59 | Temp 98.2°F | Resp 20 | Ht 66.0 in | Wt 235.3 lb

## 2012-09-22 DIAGNOSIS — C189 Malignant neoplasm of colon, unspecified: Secondary | ICD-10-CM

## 2012-09-22 LAB — CBC WITH DIFFERENTIAL/PLATELET
BASO%: 0.9 % (ref 0.0–2.0)
Basophils Absolute: 0.1 10*3/uL (ref 0.0–0.1)
EOS%: 5.2 % (ref 0.0–7.0)
Eosinophils Absolute: 0.3 10*3/uL (ref 0.0–0.5)
HCT: 40.2 % (ref 34.8–46.6)
HGB: 13.2 g/dL (ref 11.6–15.9)
LYMPH%: 28.7 % (ref 14.0–49.7)
MCH: 31.6 pg (ref 25.1–34.0)
MCHC: 32.9 g/dL (ref 31.5–36.0)
MCV: 96 fL (ref 79.5–101.0)
MONO#: 0.5 10*3/uL (ref 0.1–0.9)
MONO%: 7.9 % (ref 0.0–14.0)
NEUT#: 3.7 10*3/uL (ref 1.5–6.5)
NEUT%: 57.3 % (ref 38.4–76.8)
Platelets: 244 10*3/uL (ref 145–400)
RBC: 4.19 10*6/uL (ref 3.70–5.45)
RDW: 21 % — ABNORMAL HIGH (ref 11.2–14.5)
WBC: 6.5 10*3/uL (ref 3.9–10.3)
lymph#: 1.9 10*3/uL (ref 0.9–3.3)

## 2012-09-22 NOTE — Telephone Encounter (Signed)
gv pt appt schedule for October.  °

## 2012-09-22 NOTE — Progress Notes (Signed)
   [facility]    OFFICE PROGRESS NOTE   INTERVAL HISTORY:   She returns as scheduled. She began the most recent cycle of Xeloda on 09/03/2012. No mouth sores or diarrhea. No hand or foot pain. She has developed desquamation of the skin at the soles. There is purulent drainage at the right great toe. She was seen in an urgent care facility over the weekend and started Bactrim DS on 09/20/2012.  Objective:  Vital signs in last 24 hours:  Blood pressure 174/97, pulse 59, temperature 98.2 F (36.8 C), temperature source Oral, resp. rate 20, height 5\' 6"  (1.676 m), weight 235 lb 4.8 oz (106.731 kg).    HEENT: No thrush or ulcers Resp: Lungs clear bilaterally Cardio: Regular rate and rhythm GI: No hepatosplenomegaly, nontender Vascular: No leg edema  Skin: Mild erythema over the palms without skin breakdown. Dry desquamation at the soles with mild erythema bilaterally. There is a purulent drainage at the medial aspect of the right great toenail with surrounding induration/erythema. Erythema does not extend proximally over the toe    Lab Results:  Lab Results  Component Value Date   WBC 6.5 09/22/2012   HGB 13.2 09/22/2012   HCT 40.2 09/22/2012   MCV 96.0 09/22/2012   PLT 244 09/22/2012   ANC 3.7    Medications: I have reviewed the patient's current medications.  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 She began a first cycle of adjuvant Xeloda on 04/23/2012. She completed cycle 7 of adjuvant Xeloda beginning on Jun 03 2012. 2. Hemorrhagic left ovarian cyst  3. Microcytic anemia -resolved 4. Right kidney stones noted on the preoperative CT February 2014  5. history of Diarrhea/frequent bowel movements-likely related to surgery  6. Hot flashes and emotional swings-? Menopause. She has been seen by gynecology. The estrogen and FSH levels were not in  the postmenopausal range on 06/24/2012  7. status post multiple skin biopsies - she will followup with her dermatologist for repeat excision at the right toe biopsy site as recommended  8. Hypertension. She will followup with her primary physician.  9. Hand-foot syndrome secondary to Xeloda.  10.Purulent drainage/induration at the right great toenail-? Related to the recent skin biopsy or chemotherapy   Disposition:  She has hand/foot symptoms secondary to Xeloda and the right great toenail appears infected. She has decided to discontinue Xeloda chemotherapy after completing 7 cycles. She understands the "standard" is to complete 6 months of adjuvant therapy.  She will contact us for a fever or increased erythema at the right great toe. Ms. Bowdish will return for an office visit and CEA in approximately one month.  I encouraged her to followup with her primary physician for management of hypertension.   Thornton Papas, MD  09/22/2012  4:59 PM

## 2012-10-01 ENCOUNTER — Telehealth: Payer: Self-pay | Admitting: *Deleted

## 2012-10-01 NOTE — Telephone Encounter (Signed)
Message from [facility] following up on Xeloda Rx. Returned call to [facility], informed her pt has decided to discontinue Xeloda treatment. They will close her file.

## 2012-10-14 ENCOUNTER — Other Ambulatory Visit (HOSPITAL_BASED_OUTPATIENT_CLINIC_OR_DEPARTMENT_OTHER): Payer: PRIVATE HEALTH INSURANCE | Admitting: Lab

## 2012-10-14 DIAGNOSIS — C180 Malignant neoplasm of cecum: Secondary | ICD-10-CM

## 2012-10-14 DIAGNOSIS — C189 Malignant neoplasm of colon, unspecified: Secondary | ICD-10-CM

## 2012-10-15 LAB — CEA: CEA: 2 ng/mL (ref 0.0–5.0)

## 2012-10-21 ENCOUNTER — Ambulatory Visit (HOSPITAL_BASED_OUTPATIENT_CLINIC_OR_DEPARTMENT_OTHER): Payer: PRIVATE HEALTH INSURANCE | Admitting: Oncology

## 2012-10-21 VITALS — BP 148/73 | HR 64 | Temp 97.5°F | Resp 20 | Ht 66.0 in | Wt 237.3 lb

## 2012-10-21 DIAGNOSIS — R232 Flushing: Secondary | ICD-10-CM

## 2012-10-21 DIAGNOSIS — N83209 Unspecified ovarian cyst, unspecified side: Secondary | ICD-10-CM

## 2012-10-21 DIAGNOSIS — C189 Malignant neoplasm of colon, unspecified: Secondary | ICD-10-CM

## 2012-10-21 DIAGNOSIS — C180 Malignant neoplasm of cecum: Secondary | ICD-10-CM

## 2012-10-21 DIAGNOSIS — I10 Essential (primary) hypertension: Secondary | ICD-10-CM

## 2012-10-21 DIAGNOSIS — K458 Other specified abdominal hernia without obstruction or gangrene: Secondary | ICD-10-CM

## 2012-10-21 NOTE — Progress Notes (Signed)
Reports no further drainage from right great toe-nails are discolored. Hot flashes/mood swings are improved.

## 2012-10-21 NOTE — Progress Notes (Signed)
   [facility]    OFFICE PROGRESS NOTE   INTERVAL HISTORY:   Returns for scheduled visit. She feels better since completing chemotherapy. No further drainage at the right great toe. No complaint.  Objective:  Vital signs in last 24 hours:  Blood pressure 148/73, pulse 64, temperature 97.5 F (36.4 C), temperature source Oral, resp. rate 20, height 5\' 6"  (1.676 m), weight 237 lb 4.8 oz (107.639 kg).    HEENT: Neck without mass Lymphatics: No cervical, supraclavicular, axillary, or inguinal nodes Resp: Lungs clear bilaterally Cardio: Regular rate and rhythm GI: No hepatomegaly, no mass, ventral hernia Vascular: No leg edema  Skin: Mild hyperpigmentation at the palms, superficial dry desquamation at the soles.    Lab Results:  Lab Results  Component Value Date   WBC 6.5 09/22/2012   HGB 13.2 09/22/2012   HCT 40.2 09/22/2012   MCV 96.0 09/22/2012   PLT 244 09/22/2012   ANC 3.7  CEA on 10/14/2012-2.0   Medications: I have reviewed the patient's current medications.  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 She began a first cycle of adjuvant Xeloda on 04/23/2012. She completed cycle 7 of adjuvant Xeloda beginning on 8/27/the start 2014.  2. Hemorrhagic left ovarian cyst  3. Microcytic anemia -resolved  4. Right kidney stones noted on the preoperative CT February 2014  5. history of Diarrhea/frequent bowel movements-likely related to surgery  6. Hot flashes and emotional swings-? Menopause. She has been seen by gynecology. The estrogen and FSH levels were not in the postmenopausal range on 06/24/2012  7. status post multiple skin biopsies - she will followup with her dermatologist for repeat excision at the right toe biopsy site as recommended  8. Hypertension. She will followup with her primary physician.  9. Hand-foot syndrome secondary to  Xeloda. Resolved   Disposition:  She has completed adjuvant chemotherapy for the stage II colon cancer. Ms. Morais will schedule a surveillance colonoscopy with Dr. Leone Payor for early 2014. She will return for an office visit and CEA in 6 months. She plans to see Dr. Magnus Ivan to discuss the abdominal hernia.   Thornton Papas, MD  10/21/2012  4:54 PM

## 2012-10-23 ENCOUNTER — Telehealth: Payer: Self-pay | Admitting: Oncology

## 2012-10-23 NOTE — Telephone Encounter (Signed)
lvm for pt regarding to April 2015 appt...mailed pt avs and letter °

## 2012-10-28 ENCOUNTER — Ambulatory Visit (INDEPENDENT_AMBULATORY_CARE_PROVIDER_SITE_OTHER): Payer: BC Managed Care – PPO | Admitting: Surgery

## 2012-10-29 ENCOUNTER — Encounter (INDEPENDENT_AMBULATORY_CARE_PROVIDER_SITE_OTHER): Payer: Self-pay | Admitting: Surgery

## 2012-10-29 ENCOUNTER — Ambulatory Visit (INDEPENDENT_AMBULATORY_CARE_PROVIDER_SITE_OTHER): Payer: PRIVATE HEALTH INSURANCE | Admitting: Surgery

## 2012-10-29 VITALS — BP 130/78 | HR 72 | Temp 97.3°F | Resp 16 | Ht 66.0 in | Wt 238.2 lb

## 2012-10-29 DIAGNOSIS — K432 Incisional hernia without obstruction or gangrene: Secondary | ICD-10-CM

## 2012-10-29 NOTE — Progress Notes (Signed)
Subjective:     Patient ID: Tonya Singh, female   DOB: [birth year], 47 y.o.   MRN: [id number]  HPI This is a patient of mine who is status post a laparoscopic assisted right colectomy for colon cancer. She has finished her chemotherapy. She has developed an asymptomatic incisional hernia.  Review of Systems     Objective:   Physical Exam On exam, she is obese, there is a nontender easily reducible incisional hernia    Assessment:     Incisional hernia     Plan:     She has an insurance but we do not carry in our office. She is attempting to switch insurances and will call me back with his head since we can schedule surgery. I explained the diagnosis to her in detail and the risks of incarceration. She will seek care elsewhere if she does not change insurance

## 2012-11-13 ENCOUNTER — Other Ambulatory Visit: Payer: Self-pay

## 2013-02-26 ENCOUNTER — Encounter: Payer: Self-pay | Admitting: Internal Medicine

## 2013-03-25 ENCOUNTER — Ambulatory Visit (AMBULATORY_SURGERY_CENTER): Payer: Self-pay | Admitting: *Deleted

## 2013-03-25 VITALS — Ht 66.5 in | Wt 250.6 lb

## 2013-03-25 DIAGNOSIS — Z85038 Personal history of other malignant neoplasm of large intestine: Secondary | ICD-10-CM

## 2013-03-25 MED ORDER — NA SULFATE-K SULFATE-MG SULF 17.5-3.13-1.6 GM/177ML PO SOLN: ORAL | Status: DC

## 2013-03-25 NOTE — Progress Notes (Signed)
Patient denies any allergies to eggs or soy. Patient denies any problems with anesthesia.  

## 2013-04-08 ENCOUNTER — Ambulatory Visit (AMBULATORY_SURGERY_CENTER): Payer: PRIVATE HEALTH INSURANCE | Admitting: Internal Medicine

## 2013-04-08 ENCOUNTER — Encounter: Payer: Self-pay | Admitting: Internal Medicine

## 2013-04-08 VITALS — BP 148/81 | HR 60 | Temp 98.1°F | Resp 12 | Ht 66.0 in | Wt 250.0 lb

## 2013-04-08 DIAGNOSIS — Z860100 Personal history of colon polyps, unspecified: Secondary | ICD-10-CM | POA: Insufficient documentation

## 2013-04-08 DIAGNOSIS — Z85038 Personal history of other malignant neoplasm of large intestine: Secondary | ICD-10-CM | POA: Insufficient documentation

## 2013-04-08 MED ORDER — SODIUM CHLORIDE 0.9 % IV SOLN: 500.0000 mL | INTRAVENOUS | Status: DC

## 2013-04-08 NOTE — Patient Instructions (Addendum)
No polyps or any signs of cancer!  Next routine colonoscopy in 3years - 2018  I appreciate the opportunity to care for you. [name], MD, FACG   YOU HAD AN ENDOSCOPIC PROCEDURE TODAY AT [facility]: Refer to the procedure report that was given to you for any specific questions about what was found during the examination.  If the procedure report does not answer your questions, please call your gastroenterologist to clarify.  If you requested that your care partner not be given the details of your procedure findings, then the procedure report has been included in a sealed envelope for you to review at your convenience later.  YOU SHOULD EXPECT: Some feelings of bloating in the abdomen. Passage of more gas than usual.  Walking can help get rid of the air that was put into your GI tract during the procedure and reduce the bloating. If you had a lower endoscopy (such as a colonoscopy or flexible sigmoidoscopy) you may notice spotting of blood in your stool or on the toilet paper. If you underwent a bowel prep for your procedure, then you may not have a normal bowel movement for a few days.  DIET: Your first meal following the procedure should be a light meal and then it is ok to progress to your normal diet.  A half-sandwich or bowl of soup is an example of a good first meal.  Heavy or fried foods are harder to digest and may make you feel nauseous or bloated.  Likewise meals heavy in dairy and vegetables can cause extra gas to form and this can also increase the bloating.  Drink plenty of fluids but you should avoid alcoholic beverages for 24 hours.  ACTIVITY: Your care partner should take you home directly after the procedure.  You should plan to take it easy, moving slowly for the rest of the day.  You can resume normal activity the day after the procedure however you should NOT DRIVE or use heavy machinery for 24 hours (because of the sedation medicines used during the test).     SYMPTOMS TO REPORT IMMEDIATELY: A gastroenterologist can be reached at any hour.  During normal business hours, 8:30 AM to 5:00 PM Monday through Friday, call [phone].  After hours and on weekends, please call the GI answering service at [phone] who will take a message and have the physician on call contact you.   Following lower endoscopy (colonoscopy or flexible sigmoidoscopy):  Excessive amounts of blood in the stool  Significant tenderness or worsening of abdominal pains  Swelling of the abdomen that is new, acute  Fever of 100F or higher  Following upper endoscopy (EGD)  Vomiting of blood or coffee ground material  New chest pain or pain under the shoulder blades  Painful or persistently difficult swallowing  New shortness of breath  Fever of 100F or higher  Black, tarry-looking stools  FOLLOW UP: If any biopsies were taken you will be contacted by phone or by letter within the next 1-3 weeks.  Call your gastroenterologist if you have not heard about the biopsies in 3 weeks.  Our staff will call the home number listed on your records the next business day following your procedure to check on you and address any questions or concerns that you may have at that time regarding the information given to you following your procedure. This is a courtesy call and so if there is no answer at the home number and we  have not heard from you through the emergency physician on call, we will assume that you have returned to your regular daily activities without incident.  SIGNATURES/CONFIDENTIALITY: You and/or your care partner have signed paperwork which will be entered into your electronic medical record.  These signatures attest to the fact that that the information above on your After Visit Summary has been reviewed and is understood.  Full responsibility of the confidentiality of this discharge information lies with you and/or your care-partner.   Information on diverticulosis  given to you today

## 2013-04-08 NOTE — Progress Notes (Signed)
Rechecked patient's heart rate, 60/min following admission process.

## 2013-04-08 NOTE — Op Note (Signed)
[facility]  [address] [place], [address]   COLONOSCOPY PROCEDURE REPORT  PATIENT: Tonya, Singh  MR#: [id number] BIRTHDATE: 1965/10/11 , 19  yrs. old GENDER: Female ENDOSCOPIST: [name], MD, [facility] PROCEDURE DATE:  04/08/2013 PROCEDURE:   Colonoscopy, surveillance First Screening Colonoscopy - Avg.  risk and is 50 yrs.  old or older - No.  Prior Negative Screening - Now for repeat screening. N/A  History of Adenoma - Now for follow-up colonoscopy & has been > or = to 3 yrs.  No.  It has been less than 3 yrs since last colonoscopy.  Medical reason.  Polyps Removed Today? No.  Recommend repeat exam, <10 yrs? Yes.  High risk (family or personal hx). ASA CLASS:   Class II INDICATIONS:High risk patient with personal history of colon cancer and High risk patient with personal history of colonic polyps. 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  A digital rectal exam revealed no abnormalities of the rectum.   The LB [id number]  endoscope was introduced through the anus and advanced to the surgical anastomosis. No adverse events experienced.   The quality of the prep was Suprep good  The instrument was then slowly withdrawn as the colon was fully examined.      COLON FINDINGS: There was evidence of a prior ileocolonic surgical anastomosis The finding was in the right colon and it was normal. There was mild scattered diverticulosis noted in the transverse colon.   The colon mucosa was otherwise normal.  Retroflexed views revealed no abnormalities. The time to cecum=2 minutes 02 seconds. Withdrawal time=7 minutes  48 seconds.  The scope was withdrawn and the procedure completed. COMPLICATIONS: There were no complications.  ENDOSCOPIC IMPRESSION: 1.   There was evidence of a prior ileocolonic surgical anastomosis in the right colon 2.   There was mild diverticulosis noted in the transverse colon 3.   The colon mucosa was otherwise normal - good prep - hx cecal carcinoma and adenoma removal 2014  RECOMMENDATIONS: Repeat Colonoscopy in 3 years - 2018   eSigned:  [name], MD, [facility] 04/08/2013 9:40 AM   cc: The Patient   and [facility]

## 2013-04-08 NOTE — Progress Notes (Signed)
Lidocaine-40mg IV prior to Propofol InductionPropofol given over incremental dosages 

## 2013-04-09 ENCOUNTER — Telehealth: Payer: Self-pay

## 2013-04-09 NOTE — Telephone Encounter (Signed)
  Follow up Call-  Call back number 04/08/2013 02/12/2012  Post procedure Call Back phone  # [phone]  Permission to leave phone message Yes Yes     Patient questions:  Do you have a fever, pain , or abdominal swelling? no Pain Score  0 *  Have you tolerated food without any problems? yes  Have you been able to return to your normal activities? yes  Do you have any questions about your discharge instructions: Diet   no Medications  no Follow up visit  no  Do you have questions or concerns about your Care? no  Actions: * If pain score is 4 or above: No action needed, pain <4.  No problems per the pt. Maw

## 2013-04-23 ENCOUNTER — Telehealth: Payer: Self-pay | Admitting: Oncology

## 2013-04-23 ENCOUNTER — Ambulatory Visit (HOSPITAL_BASED_OUTPATIENT_CLINIC_OR_DEPARTMENT_OTHER): Payer: PRIVATE HEALTH INSURANCE | Admitting: Nurse Practitioner

## 2013-04-23 ENCOUNTER — Other Ambulatory Visit (HOSPITAL_BASED_OUTPATIENT_CLINIC_OR_DEPARTMENT_OTHER): Payer: PRIVATE HEALTH INSURANCE

## 2013-04-23 VITALS — BP 157/95 | HR 61 | Temp 97.2°F | Resp 20 | Ht 66.0 in | Wt 245.0 lb

## 2013-04-23 DIAGNOSIS — L270 Generalized skin eruption due to drugs and medicaments taken internally: Secondary | ICD-10-CM

## 2013-04-23 DIAGNOSIS — C189 Malignant neoplasm of colon, unspecified: Secondary | ICD-10-CM

## 2013-04-23 DIAGNOSIS — C180 Malignant neoplasm of cecum: Secondary | ICD-10-CM

## 2013-04-23 DIAGNOSIS — I10 Essential (primary) hypertension: Secondary | ICD-10-CM

## 2013-04-23 LAB — CEA: CEA: 2.4 ng/mL (ref 0.0–5.0)

## 2013-04-23 NOTE — Telephone Encounter (Signed)
Call pt and left Message for lab and MD for October 2015, mailed appt

## 2013-04-23 NOTE — Progress Notes (Signed)
  [facility] OFFICE PROGRESS NOTE   Diagnosis:  Stage II colon cancer.  INTERVAL HISTORY:   Tonya Singh returns for scheduled followup. No complaints. Bowels moving regularly. No change in bowel habits. She denies abdominal pain. No bloody or black stools. She has a good appetite.  She reports her blood pressure has been elevated similar to today's value in the recent past. She plans to contact her primary doctor  Objective:  Vital signs in last 24 hours:  Blood pressure 157/95, pulse 61, temperature 97.2 F (36.2 C), temperature source Oral, resp. rate 20, height $RemoveBe'5\' 6"'BeerhSrrY$  (1.676 m), weight 245 lb (111.131 kg).    HEENT: No thrush or ulcerations. Lymphatics: No palpable cervical, supraclavicular, axillary or inguinal lymph nodes. Resp: Lungs are clear. Cardio: Regular cardiac rhythm. GI: Abdomen soft and nontender. No hepatomegaly. No mass. Ventral hernia. Vascular: No leg edema.   Lab Results:  Lab Results  Component Value Date   WBC 6.5 09/22/2012   HGB 13.2 09/22/2012   HCT 40.2 09/22/2012   MCV 96.0 09/22/2012   PLT 244 09/22/2012   NEUTROABS 3.7 09/22/2012   CEA pending.  Imaging:  No results found.  Medications: I have reviewed the patient's current medications.  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 She began a first cycle of adjuvant Xeloda on 04/23/2012. She completed cycle 7 of adjuvant Xeloda beginning on 09/03/2012. 2. Hemorrhagic left ovarian cyst.  3. History of microcytic anemia.  4. Right kidney stones noted on the preoperative CT February 2014.  5. History of diarrhea/frequent bowel movements-likely related to surgery.  6. Hot flashes and emotional swings-? Menopause. She has been seen by gynecology. The estrogen and FSH levels were not in the postmenopausal range on 06/24/2012.  7. Status post multiple skin biopsies - she  will followup with her dermatologist for repeat excision at the right toe biopsy site as recommended.  8. Hypertension. She will followup with her primary physician.  9. History of hand-foot syndrome secondary to Xeloda.  10. Surveillance colonoscopy 04/08/2013. Evidence of prior ileocolonic surgical anastomosis. Mild diverticulosis noted in the transverse colon. Colon mucosa otherwise normal. Repeat colonoscopy in 3 years.   Disposition: She appears stable. She remains in clinical remission from colon cancer. We will followup on the CEA from today. She will return for an office visit and CEA in 6 months. She will contact the office in the interim with any problems.    Owens Shark ANP/GNP-BC   04/23/2013  11:58 AM

## 2013-04-24 ENCOUNTER — Telehealth: Payer: Self-pay | Admitting: *Deleted

## 2013-04-24 NOTE — Telephone Encounter (Signed)
Message copied by Norma Fredrickson on Fri Apr 24, 2013  9:27 AM ------      Message from: Betsy Coder B      Created: Thu Apr 23, 2013  7:37 PM       Please call patient, cea is normal, f/u as scheduled ------

## 2013-04-24 NOTE — Telephone Encounter (Signed)
Called and informed patient of normal cea and to follow up as scheduled. Per Dr. Sherrill.  Patient verbalized understanding.  

## 2013-10-16 ENCOUNTER — Other Ambulatory Visit (HOSPITAL_BASED_OUTPATIENT_CLINIC_OR_DEPARTMENT_OTHER): Payer: BC Managed Care – PPO

## 2013-10-16 ENCOUNTER — Ambulatory Visit (HOSPITAL_BASED_OUTPATIENT_CLINIC_OR_DEPARTMENT_OTHER): Payer: BC Managed Care – PPO | Admitting: Oncology

## 2013-10-16 ENCOUNTER — Telehealth: Payer: Self-pay | Admitting: Oncology

## 2013-10-16 VITALS — BP 173/93 | HR 63 | Temp 98.5°F | Resp 18 | Ht 66.0 in | Wt 243.6 lb

## 2013-10-16 DIAGNOSIS — C189 Malignant neoplasm of colon, unspecified: Secondary | ICD-10-CM

## 2013-10-16 DIAGNOSIS — N8320 Unspecified ovarian cysts: Secondary | ICD-10-CM

## 2013-10-16 DIAGNOSIS — C180 Malignant neoplasm of cecum: Secondary | ICD-10-CM

## 2013-10-16 DIAGNOSIS — I10 Essential (primary) hypertension: Secondary | ICD-10-CM

## 2013-10-16 LAB — CEA: CEA: 2.8 ng/mL (ref 0.0–5.0)

## 2013-10-16 NOTE — Progress Notes (Signed)
  [facility] OFFICE PROGRESS NOTE   Diagnosis: Colon cancer  INTERVAL HISTORY:   She returns as scheduled. She feels well. The abdominal hernia is not painful. No new complaint.  Objective:  Vital signs in last 24 hours:  Blood pressure 173/93, pulse 63, temperature 98.5 F (36.9 C), temperature source Oral, resp. rate 18, height _0  (1.676 m), weight 243 lb 9.6 oz (110.496 kg), SpO2 98.00%.    HEENT: Neck without mass Lymphatics: No cervical, supraclavicular, axillary, or inguinal nodes Resp: Lungs clear bilaterally Cardio: Regular rate and rhythm GI: No hepatomegaly, reducible ventral hernia, nontender, no mass  Vascular: No leg edema   Lab Results:   Lab Results  Component Value Date   CEA 2.4 04/23/2013    Medications: I have reviewed the patient's current medications.  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 She began a first cycle of adjuvant Xeloda on 04/23/2012. She completed cycle 7 of adjuvant Xeloda beginning on 09/03/2012. 2. Hemorrhagic left ovarian cyst.  3. History of microcytic anemia.  4. Right kidney stones noted on the preoperative CT February 2014.  5. History of diarrhea/frequent bowel movements-likely related to surgery.  6. Hot flashes and emotional swings-? Menopause. She has been seen by gynecology. The estrogen and FSH levels were not in the postmenopausal range on 06/24/2012.  7. Status post multiple skin biopsies - she will followup with her dermatologist for repeat excision at the right toe biopsy site as recommended.  8. Hypertension. I recommended she schedule an appointment with her primary physician 9. History of hand-foot syndrome secondary to Xeloda.  10. Surveillance colonoscopy 04/08/2013. Evidence of prior ileocolonic surgical anastomosis. Mild diverticulosis noted in the transverse colon. Colon mucosa  otherwise normal. Repeat colonoscopy in 3 years.     Disposition:  Tonya Singh remains in clinical remission from colon cancer. She will return for an office visit and CEA in 6 months. I recommended she schedule an appointment with Dr. Manson Allan evaluate the hypertension.  Betsy Coder, MD  10/16/2013  9:20 AM

## 2013-10-16 NOTE — Telephone Encounter (Signed)
gv pt appt schedule for april 2016.  °

## 2013-10-21 ENCOUNTER — Telehealth: Payer: Self-pay | Admitting: *Deleted

## 2013-10-21 NOTE — Telephone Encounter (Signed)
Left VM to check her MyChart account for lab results.

## 2013-10-21 NOTE — Telephone Encounter (Signed)
Message copied by Tania Ade on Wed Oct 21, 2013 12:56 PM ------      Message from: Ladell Pier      Created: Mon Oct 19, 2013  4:24 PM       Please call patient, cea is normal ------

## 2014-04-02 ENCOUNTER — Other Ambulatory Visit: Payer: Self-pay | Admitting: Internal Medicine

## 2014-04-03 ENCOUNTER — Other Ambulatory Visit: Payer: Self-pay

## 2014-04-03 MED ORDER — PANTOPRAZOLE SODIUM 40 MG PO TBEC: 40.0000 mg | DELAYED_RELEASE_TABLET | Freq: Every day | ORAL | Status: DC

## 2014-04-03 NOTE — Telephone Encounter (Signed)
Patient my Chart messaged for refill.  Protonix sent in as requested.

## 2014-04-16 ENCOUNTER — Telehealth: Payer: Self-pay | Admitting: Oncology

## 2014-04-16 ENCOUNTER — Other Ambulatory Visit (HOSPITAL_BASED_OUTPATIENT_CLINIC_OR_DEPARTMENT_OTHER): Payer: BLUE CROSS/BLUE SHIELD

## 2014-04-16 ENCOUNTER — Ambulatory Visit (HOSPITAL_BASED_OUTPATIENT_CLINIC_OR_DEPARTMENT_OTHER): Payer: BLUE CROSS/BLUE SHIELD | Admitting: Oncology

## 2014-04-16 VITALS — BP 125/67 | HR 56 | Temp 98.6°F | Resp 17 | Ht 66.0 in | Wt 243.7 lb

## 2014-04-16 DIAGNOSIS — Z85038 Personal history of other malignant neoplasm of large intestine: Secondary | ICD-10-CM | POA: Diagnosis not present

## 2014-04-16 DIAGNOSIS — C189 Malignant neoplasm of colon, unspecified: Secondary | ICD-10-CM

## 2014-04-16 DIAGNOSIS — R05 Cough: Secondary | ICD-10-CM | POA: Diagnosis not present

## 2014-04-16 NOTE — Progress Notes (Signed)
  [facility] OFFICE PROGRESS NOTE   Diagnosis: Colon cancer  INTERVAL HISTORY:   Mr. Tonya Singh returns as scheduled. She feels well. No difficulty with bowel function. Good appetite. She is now seeing Dr. Delena Singh for treatment of hypertension. She reports developing a nonproductive cough since beginning lisinopril.  Objective:  Vital signs in last 24 hours:  Blood pressure 125/67, pulse 56, temperature 98.6 F (37 C), temperature source Oral, resp. rate 17, height $RemoveBe'5\' 6"'OcdJBRteT$  (1.676 m), weight 243 lb 11.2 oz (110.542 kg), SpO2 98 %.    HEENT: Neck without mass Lymphatics: No cervical, supra-clavicular, axillary, or inguinal nodes Resp: Lungs clear bilaterally Cardio: Regular rate and rhythm GI: No hepatomegaly, nontender, no mass Vascular: No leg edema  Skin: Faint erythema at the pretibial area bilaterally     Lab Results:    Lab Results  Component Value Date   CEA 2.8 10/16/2013    Imaging:  No results found.  Medications: I have reviewed the patient's current medications.  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 She began a first cycle of adjuvant Xeloda on 04/23/2012. She completed cycle 7 of adjuvant Xeloda beginning on 09/03/2012.  2. Hemorrhagic left ovarian cyst.  3. History of microcytic anemia.  4. Right kidney stones noted on the preoperative CT February 2014.  5. History of diarrhea/frequent bowel movements-likely related to surgery.  6. Hot flashes and emotional swings-? Menopause. She has been seen by gynecology. The estrogen and FSH levels were not in the postmenopausal range on 06/24/2012.  7. Status post multiple skin biopsies - she will followup with her dermatologist for repeat excision at the right toe biopsy site as recommended.  8. Hypertension. 9. History of hand-foot syndrome secondary to Xeloda.  10. Surveillance  colonoscopy 04/08/2013. Evidence of prior ileocolonic surgical anastomosis. Mild diverticulosis noted in the transverse colon. Colon mucosa otherwise normal. Repeat colonoscopy in 3 years.      Disposition:  Tonya Singh remains in clinical remission from colon cancer. We will follow-up on the CEA from today. She will contact Dr. Delena Singh to discuss the cough she developed after beginning lisinopril. She will return for an office visit and CEA in 6 months.  Tonya Coder, MD  04/16/2014  8:42 AM

## 2014-04-16 NOTE — Telephone Encounter (Signed)
Gave patient avs report and appointments for October 2016

## 2014-04-17 LAB — CEA: CEA: 2.4 ng/mL (ref 0.0–5.0)

## 2014-10-15 ENCOUNTER — Ambulatory Visit (HOSPITAL_BASED_OUTPATIENT_CLINIC_OR_DEPARTMENT_OTHER): Payer: BLUE CROSS/BLUE SHIELD | Admitting: Nurse Practitioner

## 2014-10-15 ENCOUNTER — Telehealth: Payer: Self-pay | Admitting: Oncology

## 2014-10-15 ENCOUNTER — Other Ambulatory Visit (HOSPITAL_BASED_OUTPATIENT_CLINIC_OR_DEPARTMENT_OTHER): Payer: BLUE CROSS/BLUE SHIELD

## 2014-10-15 VITALS — BP 141/79 | HR 70 | Temp 98.7°F | Resp 17 | Ht 66.0 in | Wt 245.4 lb

## 2014-10-15 DIAGNOSIS — Z85038 Personal history of other malignant neoplasm of large intestine: Secondary | ICD-10-CM

## 2014-10-15 DIAGNOSIS — Z23 Encounter for immunization: Secondary | ICD-10-CM

## 2014-10-15 DIAGNOSIS — C189 Malignant neoplasm of colon, unspecified: Secondary | ICD-10-CM

## 2014-10-15 MED ORDER — INFLUENZA VAC SPLIT QUAD 0.5 ML IM SUSY
0.5000 mL | PREFILLED_SYRINGE | Freq: Once | INTRAMUSCULAR | Status: AC
  Administered 2014-10-15: 0.5 mL via INTRAMUSCULAR
  Filled 2014-10-15: qty 0.5

## 2014-10-15 NOTE — Telephone Encounter (Signed)
Appointments made and avs will be printed at checkout

## 2014-10-15 NOTE — Progress Notes (Signed)
  [facility] OFFICE PROGRESS NOTE   Diagnosis:  Colon cancer  INTERVAL HISTORY:   Tonya Singh returns as scheduled. Overall she feels well. No change in bowel habits. No bloody or black stools. No abdominal pain. No nausea or vomiting. No shortness of breath. She has a good appetite. Her cough resolved coinciding with discontinuation of lisinopril.  Objective:  Vital signs in last 24 hours:  Blood pressure 141/79, pulse 70, temperature 98.7 F (37.1 C), temperature source Oral, resp. rate 17, height $RemoveBe'5\' 6"'NOotCIcUs$  (1.676 m), weight 245 lb 6.4 oz (111.313 kg), SpO2 98 %.    HEENT: No thrush or ulcers. Lymphatics: No palpable cervical, supraclavicular, axillary or inguinal lymph nodes. Resp: Lungs clear bilaterally. Cardio: Regular rate and rhythm. GI: Abdomen soft and nontender. No hepatomegaly. Vascular: No leg edema.  Skin: Bilateral pretibial regions with faint erythema.    Lab Results:  Lab Results  Component Value Date   WBC 6.5 09/22/2012   HGB 13.2 09/22/2012   HCT 40.2 09/22/2012   MCV 96.0 09/22/2012   PLT 244 09/22/2012   NEUTROABS 3.7 09/22/2012    Imaging:  No results found.  Medications: I have reviewed the patient's current medications.  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 She began a first cycle of adjuvant Xeloda on 04/23/2012. She completed cycle 7 of adjuvant Xeloda beginning on 09/03/2012.  2. Hemorrhagic left ovarian cyst.  3. History of microcytic anemia.  4. Right kidney stones noted on the preoperative CT February 2014.  5. History of diarrhea/frequent bowel movements-likely related to surgery.  6. Hot flashes and emotional swings-? Menopause. She has been seen by gynecology. The estrogen and FSH levels were not in the postmenopausal range on 06/24/2012.  7. Status post multiple skin biopsies - she will followup  with her dermatologist for repeat excision at the right toe biopsy site as recommended.  8. Hypertension. 9. History of hand-foot syndrome secondary to Xeloda.  10. Surveillance colonoscopy 04/08/2013. Evidence of prior ileocolonic surgical anastomosis. Mild diverticulosis noted in the transverse colon. Colon mucosa otherwise normal. Repeat colonoscopy in 3 years.     Disposition: Tonya Singh remains in clinical remission from colon cancer. We will follow-up on the CEA from today. She will return for a follow-up visit and CEA in 6 months.    Ned Card ANP/GNP-BC   10/15/2014  12:41 PM

## 2014-10-18 LAB — CEA: CEA: 2.9 ng/mL (ref 0.0–5.0)

## 2014-10-25 ENCOUNTER — Other Ambulatory Visit: Payer: Self-pay | Admitting: Internal Medicine

## 2015-04-18 ENCOUNTER — Telehealth: Payer: Self-pay | Admitting: Oncology

## 2015-04-18 ENCOUNTER — Other Ambulatory Visit (HOSPITAL_BASED_OUTPATIENT_CLINIC_OR_DEPARTMENT_OTHER): Payer: BLUE CROSS/BLUE SHIELD

## 2015-04-18 ENCOUNTER — Ambulatory Visit (HOSPITAL_BASED_OUTPATIENT_CLINIC_OR_DEPARTMENT_OTHER): Payer: BLUE CROSS/BLUE SHIELD | Admitting: Oncology

## 2015-04-18 VITALS — BP 143/71 | HR 55 | Temp 98.2°F | Resp 20 | Ht 66.0 in | Wt 244.7 lb

## 2015-04-18 DIAGNOSIS — Z85038 Personal history of other malignant neoplasm of large intestine: Secondary | ICD-10-CM

## 2015-04-18 DIAGNOSIS — C182 Malignant neoplasm of ascending colon: Secondary | ICD-10-CM

## 2015-04-18 DIAGNOSIS — R7302 Impaired glucose tolerance (oral): Secondary | ICD-10-CM | POA: Diagnosis not present

## 2015-04-18 NOTE — Progress Notes (Signed)
  [facility] OFFICE PROGRESS NOTE   Diagnosis: Colon cancer  INTERVAL HISTORY:   She returns as scheduled. She feels well. She is working. She reports a cough for the past 2 weeks. She relates this to seasonal allergies.  Objective:  Vital signs in last 24 hours:  Blood pressure 143/71, pulse 55, temperature 98.2 F (36.8 C), temperature source Oral, resp. rate 20, height '5\' 6"'$  (1.676 m), weight 244 lb 11.2 oz (110.995 kg), SpO2 98 %.    HEENT: Neck without mass Lymphatics: No cervical, supra-clavicular, axillary, or inguinal nodes Resp: Coarse rhonchi at the left posterior base that cleared after several respirations, no respiratory distress Cardio: Regular rate and rhythm GI: No hepatomegaly, no mass, nontender, ventral hernia Vascular: No leg edema   Lab Results: CEA pending    Medications: I have reviewed the patient's current medications.  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 She began a first cycle of adjuvant Xeloda on 04/23/2012. She completed cycle 7 of adjuvant Xeloda beginning on 09/03/2012.  2. Hemorrhagic left ovarian cyst.  3. History of microcytic anemia.  4. Right kidney stones noted on the preoperative CT February 2014.  5. History of diarrhea/frequent bowel movements-likely related to surgery.  6. Hot flashes and emotional swings-? Menopause. She has been seen by gynecology. The estrogen and FSH levels were not in the postmenopausal range on 06/24/2012.  7. Status post multiple skin biopsies  8. Hypertension. 9. History of hand-foot syndrome secondary to Xeloda.  10. Surveillance colonoscopy 04/08/2013. Evidence of prior ileocolonic surgical anastomosis. Mild diverticulosis noted in the transverse colon. Colon mucosa otherwise normal. Repeat colonoscopy in 3 years.     Disposition:  Ms. Falconi remains in  clinical remission from colon cancer. We will follow-up on the CEA from today. She will return for an office visit and CEA in one year. She will be due for a surveillance colonoscopy in one year.  Betsy Coder, MD  04/18/2015  12:26 PM

## 2015-04-18 NOTE — Telephone Encounter (Signed)
Gave pt appt for April & avs

## 2015-04-19 LAB — CEA (PARALLEL TESTING): CEA: 3.1 ng/mL — ABNORMAL HIGH

## 2015-04-19 LAB — CEA: CEA: 5 ng/mL — ABNORMAL HIGH (ref 0.0–4.7)

## 2015-04-21 ENCOUNTER — Telehealth: Payer: Self-pay | Admitting: *Deleted

## 2015-04-21 DIAGNOSIS — C182 Malignant neoplasm of ascending colon: Secondary | ICD-10-CM

## 2015-04-21 NOTE — Telephone Encounter (Signed)
Per Dr. Benay Spice, pt notified that cea is not significantly changed and is slightly high by new [facility] method and is very likely not related to her cancer.  Will repeat cea in 3 months per Dr. Benay Spice.  Pt has no questions or concerns at this time.

## 2015-04-21 NOTE — Telephone Encounter (Signed)
-----   Message from Ladell Pier, MD sent at 04/21/2015  3:22 PM EDT ----- Please call patient, cea not significantly changed, slightly high by new [facility] method, very likely not related to cancer, will repeat in 3 months to be sure it is not increasing, please schedule cea for 3 months

## 2015-04-22 ENCOUNTER — Telehealth: Payer: Self-pay | Admitting: Oncology

## 2015-04-22 NOTE — Telephone Encounter (Signed)
spoke w/ pt comfirmed july appt

## 2015-05-07 DIAGNOSIS — E119 Type 2 diabetes mellitus without complications: Secondary | ICD-10-CM | POA: Diagnosis not present

## 2015-05-07 DIAGNOSIS — R319 Hematuria, unspecified: Secondary | ICD-10-CM | POA: Diagnosis not present

## 2015-05-07 DIAGNOSIS — K219 Gastro-esophageal reflux disease without esophagitis: Secondary | ICD-10-CM | POA: Diagnosis not present

## 2015-05-07 DIAGNOSIS — I10 Essential (primary) hypertension: Secondary | ICD-10-CM | POA: Diagnosis not present

## 2015-05-20 DIAGNOSIS — R319 Hematuria, unspecified: Secondary | ICD-10-CM | POA: Diagnosis not present

## 2015-05-24 DIAGNOSIS — R319 Hematuria, unspecified: Secondary | ICD-10-CM | POA: Diagnosis not present

## 2015-05-24 DIAGNOSIS — N132 Hydronephrosis with renal and ureteral calculous obstruction: Secondary | ICD-10-CM | POA: Diagnosis not present

## 2015-05-24 DIAGNOSIS — N200 Calculus of kidney: Secondary | ICD-10-CM | POA: Diagnosis not present

## 2015-05-26 DIAGNOSIS — R310 Gross hematuria: Secondary | ICD-10-CM | POA: Diagnosis not present

## 2015-05-26 DIAGNOSIS — Z87891 Personal history of nicotine dependence: Secondary | ICD-10-CM | POA: Diagnosis not present

## 2015-05-26 DIAGNOSIS — R0683 Snoring: Secondary | ICD-10-CM | POA: Diagnosis not present

## 2015-05-26 DIAGNOSIS — N200 Calculus of kidney: Secondary | ICD-10-CM | POA: Diagnosis not present

## 2015-05-26 DIAGNOSIS — I10 Essential (primary) hypertension: Secondary | ICD-10-CM | POA: Diagnosis not present

## 2015-05-26 DIAGNOSIS — E119 Type 2 diabetes mellitus without complications: Secondary | ICD-10-CM | POA: Diagnosis not present

## 2015-05-27 DIAGNOSIS — I10 Essential (primary) hypertension: Secondary | ICD-10-CM | POA: Diagnosis not present

## 2015-05-27 DIAGNOSIS — E119 Type 2 diabetes mellitus without complications: Secondary | ICD-10-CM | POA: Diagnosis not present

## 2015-05-27 DIAGNOSIS — N200 Calculus of kidney: Secondary | ICD-10-CM | POA: Diagnosis not present

## 2015-05-27 DIAGNOSIS — E669 Obesity, unspecified: Secondary | ICD-10-CM | POA: Diagnosis not present

## 2015-06-08 DIAGNOSIS — N200 Calculus of kidney: Secondary | ICD-10-CM | POA: Diagnosis not present

## 2015-06-11 DIAGNOSIS — Z6837 Body mass index (BMI) 37.0-37.9, adult: Secondary | ICD-10-CM | POA: Diagnosis not present

## 2015-06-11 DIAGNOSIS — E119 Type 2 diabetes mellitus without complications: Secondary | ICD-10-CM | POA: Diagnosis not present

## 2015-06-11 DIAGNOSIS — Z1389 Encounter for screening for other disorder: Secondary | ICD-10-CM | POA: Diagnosis not present

## 2015-06-11 DIAGNOSIS — E669 Obesity, unspecified: Secondary | ICD-10-CM | POA: Diagnosis not present

## 2015-06-11 DIAGNOSIS — L732 Hidradenitis suppurativa: Secondary | ICD-10-CM | POA: Diagnosis not present

## 2015-06-14 DIAGNOSIS — L300 Nummular dermatitis: Secondary | ICD-10-CM | POA: Diagnosis not present

## 2015-06-14 DIAGNOSIS — D225 Melanocytic nevi of trunk: Secondary | ICD-10-CM | POA: Diagnosis not present

## 2015-06-14 DIAGNOSIS — L299 Pruritus, unspecified: Secondary | ICD-10-CM | POA: Diagnosis not present

## 2015-06-14 DIAGNOSIS — L814 Other melanin hyperpigmentation: Secondary | ICD-10-CM | POA: Diagnosis not present

## 2015-06-14 DIAGNOSIS — D2239 Melanocytic nevi of other parts of face: Secondary | ICD-10-CM | POA: Diagnosis not present

## 2015-06-30 DIAGNOSIS — N200 Calculus of kidney: Secondary | ICD-10-CM | POA: Diagnosis not present

## 2015-06-30 DIAGNOSIS — R1031 Right lower quadrant pain: Secondary | ICD-10-CM | POA: Diagnosis not present

## 2015-07-14 DIAGNOSIS — R1031 Right lower quadrant pain: Secondary | ICD-10-CM | POA: Diagnosis not present

## 2015-07-14 DIAGNOSIS — N200 Calculus of kidney: Secondary | ICD-10-CM | POA: Diagnosis not present

## 2015-07-14 DIAGNOSIS — R319 Hematuria, unspecified: Secondary | ICD-10-CM | POA: Diagnosis not present

## 2015-07-18 ENCOUNTER — Other Ambulatory Visit: Payer: BLUE CROSS/BLUE SHIELD

## 2015-07-18 DIAGNOSIS — C182 Malignant neoplasm of ascending colon: Secondary | ICD-10-CM | POA: Diagnosis not present

## 2015-07-19 LAB — CEA: CEA: 5.5 ng/mL — ABNORMAL HIGH (ref 0.0–4.7)

## 2015-07-19 LAB — CEA (PARALLEL TESTING): CEA: 3.1 ng/mL — ABNORMAL HIGH

## 2015-07-20 ENCOUNTER — Telehealth: Payer: Self-pay

## 2015-07-20 NOTE — Telephone Encounter (Signed)
Called and left message for patient to call back to go over lab results.  

## 2015-07-20 NOTE — Telephone Encounter (Signed)
-----   Message from Ladell Pier, MD sent at 07/19/2015  4:31 PM EDT ----- Please call patient, cea is stable, f/u as scheduled, repeat 6 months

## 2015-07-20 NOTE — Telephone Encounter (Signed)
TC to Tonya Singh- Tonya Singh advised lab results. She states she is very pleased with results. She will follow up in 6 months as planned. Tonya Singh understands to call this office in the interm with any concerns or questions.

## 2015-07-21 DIAGNOSIS — N200 Calculus of kidney: Secondary | ICD-10-CM | POA: Diagnosis not present

## 2015-07-21 DIAGNOSIS — R1031 Right lower quadrant pain: Secondary | ICD-10-CM | POA: Diagnosis not present

## 2015-08-26 DIAGNOSIS — R1031 Right lower quadrant pain: Secondary | ICD-10-CM | POA: Diagnosis not present

## 2015-08-26 DIAGNOSIS — N200 Calculus of kidney: Secondary | ICD-10-CM | POA: Diagnosis not present

## 2015-09-17 DIAGNOSIS — K219 Gastro-esophageal reflux disease without esophagitis: Secondary | ICD-10-CM | POA: Diagnosis not present

## 2015-09-17 DIAGNOSIS — I10 Essential (primary) hypertension: Secondary | ICD-10-CM | POA: Diagnosis not present

## 2015-09-17 DIAGNOSIS — Z6835 Body mass index (BMI) 35.0-35.9, adult: Secondary | ICD-10-CM | POA: Diagnosis not present

## 2015-09-17 DIAGNOSIS — E119 Type 2 diabetes mellitus without complications: Secondary | ICD-10-CM | POA: Diagnosis not present

## 2015-09-17 DIAGNOSIS — H6121 Impacted cerumen, right ear: Secondary | ICD-10-CM | POA: Diagnosis not present

## 2015-09-17 DIAGNOSIS — C189 Malignant neoplasm of colon, unspecified: Secondary | ICD-10-CM | POA: Diagnosis not present

## 2015-09-17 DIAGNOSIS — E538 Deficiency of other specified B group vitamins: Secondary | ICD-10-CM | POA: Diagnosis not present

## 2015-10-07 DIAGNOSIS — R109 Unspecified abdominal pain: Secondary | ICD-10-CM | POA: Diagnosis not present

## 2015-10-07 DIAGNOSIS — N200 Calculus of kidney: Secondary | ICD-10-CM | POA: Diagnosis not present

## 2015-10-07 DIAGNOSIS — R1031 Right lower quadrant pain: Secondary | ICD-10-CM | POA: Diagnosis not present

## 2015-10-17 ENCOUNTER — Other Ambulatory Visit: Payer: Self-pay | Admitting: Surgery

## 2015-10-17 DIAGNOSIS — K432 Incisional hernia without obstruction or gangrene: Secondary | ICD-10-CM | POA: Diagnosis not present

## 2015-10-18 DIAGNOSIS — E119 Type 2 diabetes mellitus without complications: Secondary | ICD-10-CM | POA: Diagnosis not present

## 2015-10-24 ENCOUNTER — Ambulatory Visit
Admission: RE | Admit: 2015-10-24 | Discharge: 2015-10-24 | Disposition: A | Discharge status: Home / Self Care | Payer: BLUE CROSS/BLUE SHIELD | Source: Ambulatory Visit | Attending: Surgery | Admitting: Surgery

## 2015-10-24 DIAGNOSIS — K432 Incisional hernia without obstruction or gangrene: Secondary | ICD-10-CM

## 2015-10-24 DIAGNOSIS — N200 Calculus of kidney: Secondary | ICD-10-CM | POA: Diagnosis not present

## 2015-10-24 MED ORDER — IOPAMIDOL (ISOVUE-300) INJECTION 61%
125.0000 mL | Freq: Once | INTRAVENOUS | Status: AC | PRN
  Administered 2015-10-24: 125 mL via INTRAVENOUS

## 2015-10-28 DIAGNOSIS — E119 Type 2 diabetes mellitus without complications: Secondary | ICD-10-CM | POA: Diagnosis not present

## 2015-10-28 DIAGNOSIS — Z87891 Personal history of nicotine dependence: Secondary | ICD-10-CM | POA: Diagnosis not present

## 2015-10-28 DIAGNOSIS — I10 Essential (primary) hypertension: Secondary | ICD-10-CM | POA: Diagnosis not present

## 2015-10-28 DIAGNOSIS — N200 Calculus of kidney: Secondary | ICD-10-CM | POA: Diagnosis not present

## 2015-10-31 ENCOUNTER — Other Ambulatory Visit: Payer: Self-pay | Admitting: Surgery

## 2015-11-04 DIAGNOSIS — R109 Unspecified abdominal pain: Secondary | ICD-10-CM | POA: Diagnosis not present

## 2015-11-04 DIAGNOSIS — N200 Calculus of kidney: Secondary | ICD-10-CM | POA: Diagnosis not present

## 2015-11-05 DIAGNOSIS — Z23 Encounter for immunization: Secondary | ICD-10-CM | POA: Diagnosis not present

## 2015-11-05 DIAGNOSIS — L732 Hidradenitis suppurativa: Secondary | ICD-10-CM | POA: Diagnosis not present

## 2015-11-15 DIAGNOSIS — K432 Incisional hernia without obstruction or gangrene: Secondary | ICD-10-CM | POA: Diagnosis not present

## 2015-11-15 DIAGNOSIS — L732 Hidradenitis suppurativa: Secondary | ICD-10-CM | POA: Diagnosis not present

## 2015-12-05 DIAGNOSIS — I10 Essential (primary) hypertension: Secondary | ICD-10-CM | POA: Diagnosis not present

## 2015-12-05 DIAGNOSIS — K219 Gastro-esophageal reflux disease without esophagitis: Secondary | ICD-10-CM | POA: Diagnosis not present

## 2015-12-05 DIAGNOSIS — K432 Incisional hernia without obstruction or gangrene: Secondary | ICD-10-CM | POA: Diagnosis not present

## 2015-12-05 DIAGNOSIS — E119 Type 2 diabetes mellitus without complications: Secondary | ICD-10-CM | POA: Diagnosis not present

## 2015-12-22 DIAGNOSIS — N200 Calculus of kidney: Secondary | ICD-10-CM | POA: Diagnosis not present

## 2015-12-23 DIAGNOSIS — R109 Unspecified abdominal pain: Secondary | ICD-10-CM | POA: Diagnosis not present

## 2015-12-23 DIAGNOSIS — N200 Calculus of kidney: Secondary | ICD-10-CM | POA: Diagnosis not present

## 2015-12-27 DIAGNOSIS — Z9889 Other specified postprocedural states: Secondary | ICD-10-CM | POA: Diagnosis not present

## 2016-03-17 DIAGNOSIS — K219 Gastro-esophageal reflux disease without esophagitis: Secondary | ICD-10-CM | POA: Diagnosis not present

## 2016-03-17 DIAGNOSIS — I10 Essential (primary) hypertension: Secondary | ICD-10-CM | POA: Diagnosis not present

## 2016-03-17 DIAGNOSIS — C189 Malignant neoplasm of colon, unspecified: Secondary | ICD-10-CM | POA: Diagnosis not present

## 2016-03-17 DIAGNOSIS — E119 Type 2 diabetes mellitus without complications: Secondary | ICD-10-CM | POA: Diagnosis not present

## 2016-03-21 ENCOUNTER — Encounter: Payer: Self-pay | Admitting: Internal Medicine

## 2016-03-26 DIAGNOSIS — N200 Calculus of kidney: Secondary | ICD-10-CM | POA: Diagnosis not present

## 2016-03-26 DIAGNOSIS — R1031 Right lower quadrant pain: Secondary | ICD-10-CM | POA: Diagnosis not present

## 2016-03-28 DIAGNOSIS — Z1231 Encounter for screening mammogram for malignant neoplasm of breast: Secondary | ICD-10-CM | POA: Diagnosis not present

## 2016-04-16 ENCOUNTER — Telehealth: Payer: Self-pay | Admitting: Oncology

## 2016-04-16 ENCOUNTER — Other Ambulatory Visit (HOSPITAL_BASED_OUTPATIENT_CLINIC_OR_DEPARTMENT_OTHER): Payer: BLUE CROSS/BLUE SHIELD

## 2016-04-16 ENCOUNTER — Ambulatory Visit (HOSPITAL_BASED_OUTPATIENT_CLINIC_OR_DEPARTMENT_OTHER): Payer: BLUE CROSS/BLUE SHIELD | Admitting: Oncology

## 2016-04-16 VITALS — BP 125/64 | HR 60 | Temp 98.3°F | Resp 20 | Ht 66.0 in | Wt 218.3 lb

## 2016-04-16 DIAGNOSIS — C182 Malignant neoplasm of ascending colon: Secondary | ICD-10-CM | POA: Diagnosis not present

## 2016-04-16 DIAGNOSIS — Z85038 Personal history of other malignant neoplasm of large intestine: Secondary | ICD-10-CM

## 2016-04-16 DIAGNOSIS — I10 Essential (primary) hypertension: Secondary | ICD-10-CM

## 2016-04-16 LAB — CEA (IN HOUSE-CHCC): CEA (CHCC-In House): 3.5 ng/mL (ref 0.00–5.00)

## 2016-04-16 NOTE — Telephone Encounter (Signed)
Appointments scheduled for 1 year, per 04/16/16 los. Patient was given a copy of the AVS report and appointment schedule, per 04/16/16 los.

## 2016-04-16 NOTE — Progress Notes (Signed)
  [facility] OFF[facility] PROGRESS NOTE   Diagnosis: Colon cancer   INTERVAL HISTORY:   [facility] returns as scheduled. She feels well. She underwent hernia repair by Dr. Ninfa Linden last year. She thinks the hernia may have partially recurred. No difficulty with bowel function. She reports intentional weight loss with diet and exercise. She is scheduled for colonoscopy next month. She is being treated for draining skin lesions in the left axilla.  Objective:  Vital signs in last 24 hours:  Blood pressure 125/64, pulse 60, temperature 98.3 F (36.8 C), temperature source Oral, resp. rate 20, height '5\' 6"'$  (1.676 m), weight 218 lb 4.8 oz (99 kg), SpO2 97 %.    HEENT: Neck without mass  Lymphatics: No cervical, supraclavicular, axillary, or inguinal nodes  Resp: Lungs clear bilaterally  Cardio: Regular rate and rhythm  GI: No hepatosplenomegaly, no mass, nontender, ventral hernia versus disastasis Vascular: No leg edema   Skin:Hidradenitis in the left axilla With a small amount of blood tinged discharge   Lab Results: CEA-pending   Medications: I have reviewed the patient's current medications.  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 She began a first cycle of adjuvant Xeloda on 04/23/2012. She completed cycle 7 of adjuvant Xeloda beginning on 09/03/2012.  2. Hemorrhagic left ovarian cyst.  3. History of microcytic anemia.  4. Right kidney stones noted on the preoperative CT February 2014.  5. History of diarrhea/frequent bowel movements-likely related to surgery.  6. Hot flashes and emotional swings-? Menopause. She has been seen by gynecology. The estrogen and FSH levels were not in the postmenopausal range on 06/24/2012.  7. Status post multiple skin biopsies  8. Hypertension. 9. History of hand-foot syndrome secondary to Xeloda.  10.  Surveillance colonoscopy 04/08/2013. Evidence of prior ileocolonic surgical anastomosis. Mild diverticulosis noted in the transverse colon. Colon mucosa otherwise normal.      Disposition:  Tonya Singh remains in clinical remission from colon cancer. We will follow-up on the CEA from today. She will return for an office visit and CEA in one year. She is scheduled for surveillance colonoscopy next month.   15 minutes were spent with the patient today. The majority of the time was used for counseling and coordination of care.  Betsy Coder, MD  04/16/2016  8:27 AM

## 2016-04-17 ENCOUNTER — Telehealth: Payer: Self-pay | Admitting: *Deleted

## 2016-04-17 LAB — CEA: CEA: 5.4 ng/mL — ABNORMAL HIGH (ref 0.0–4.7)

## 2016-04-17 NOTE — Telephone Encounter (Signed)
Telephone call to patient advised lab results. Pt verbalized an understanding to rtc in 6 months for lab appt.

## 2016-04-17 NOTE — Telephone Encounter (Signed)
-----   Message from Ladell Pier, MD sent at 04/17/2016  6:47 AM EDT ----- Please call patient, Tonya Singh is stable, at upper end of normal range, repeat 6 months

## 2016-05-08 ENCOUNTER — Ambulatory Visit (AMBULATORY_SURGERY_CENTER): Payer: Self-pay

## 2016-05-08 VITALS — Ht 65.75 in | Wt 215.8 lb

## 2016-05-08 DIAGNOSIS — Z85038 Personal history of other malignant neoplasm of large intestine: Secondary | ICD-10-CM

## 2016-05-08 NOTE — Progress Notes (Signed)
No allergies to eggs or soy No past problems with anesthesia No diet meds No home oxygen  Registered emmi 

## 2016-05-22 ENCOUNTER — Ambulatory Visit (AMBULATORY_SURGERY_CENTER): Payer: BLUE CROSS/BLUE SHIELD | Admitting: Internal Medicine

## 2016-05-22 ENCOUNTER — Encounter: Payer: Self-pay | Admitting: Internal Medicine

## 2016-05-22 VITALS — BP 104/60 | HR 48 | Temp 98.2°F | Resp 15 | Ht 65.75 in | Wt 215.0 lb

## 2016-05-22 DIAGNOSIS — Z85038 Personal history of other malignant neoplasm of large intestine: Secondary | ICD-10-CM | POA: Diagnosis present

## 2016-05-22 DIAGNOSIS — Z1211 Encounter for screening for malignant neoplasm of colon: Secondary | ICD-10-CM | POA: Diagnosis not present

## 2016-05-22 DIAGNOSIS — D123 Benign neoplasm of transverse colon: Secondary | ICD-10-CM

## 2016-05-22 HISTORY — PX: COLONOSCOPY WITH PROPOFOL: SHX5780

## 2016-05-22 MED ORDER — SODIUM CHLORIDE 0.9 % IV SOLN: 500.0000 mL | INTRAVENOUS | Status: DC

## 2016-05-22 NOTE — Progress Notes (Signed)
To PACU. Report to RN. VSS. 

## 2016-05-22 NOTE — Op Note (Signed)
[facility] Patient Name: Tonya Singh Procedure Date: 05/22/2016 7:25 AM MRN: [id number] Endoscopist: [name] , MD Age: 51 Referring MD:  Date of Birth: [birth year] Gender: Female Account #: [id number] Procedure:                Colonoscopy Indications:              High risk colon cancer surveillance: Personal                            history of colon cancer Medicines:                Propofol per Anesthesia, Monitored Anesthesia Care Procedure:                Pre-Anesthesia Assessment:                           - Prior to the procedure, a History and Physical                            was performed, and patient medications and                            allergies were reviewed. The patient's tolerance of                            previous anesthesia was also reviewed. The risks                            and benefits of the procedure and the sedation                            options and risks were discussed with the patient.                            All questions were answered, and informed consent                            was obtained. Prior Anticoagulants: The patient has                            taken no previous anticoagulant or antiplatelet                            agents. ASA Grade Assessment: II - A patient with                            mild systemic disease. After reviewing the risks                            and benefits, the patient was deemed in                            satisfactory condition to undergo the procedure.  After obtaining informed consent, the colonoscope                            was passed under direct vision. Throughout the                            procedure, the patient's blood pressure, pulse, and                            oxygen saturations were monitored continuously. The                            Colonoscope was introduced through the anus and                            advanced to  the the ileocolonic anastomosis. The                            colonoscopy was performed without difficulty. The                            patient tolerated the procedure well. The quality                            of the bowel preparation was excellent. The bowel                            preparation used was Miralax. The terminal ileum,                            the rectum and Ileocolonic anastomsis areas were                            photographed. Scope In: 8:03:22 AM Scope Out: 8:13:07 AM Scope Withdrawal Time: 0 hours 8 minutes 14 seconds  Total Procedure Duration: 0 hours 9 minutes 45 seconds  Findings:                 The perianal and digital rectal examinations were                            normal.                           A 1 to 2 mm polyp was found in the transverse                            colon. The polyp was sessile. The polyp was removed                            with a cold biopsy forceps. Resection and retrieval                            were complete. Verification of patient  identification for the specimen was done. Estimated                            blood loss was minimal.                           Scattered small and large-mouthed diverticula were                            found in the entire colon.                           Anal papilla(e) were hypertrophied.                           The exam was otherwise without abnormality on                            direct and retroflexion views. Complications:            No immediate complications. Estimated Blood Loss:     Estimated blood loss was minimal. Impression:               - One 1 to 2 mm polyp in the transverse colon,                            removed with a cold biopsy forceps. Resected and                            retrieved.                           - Diverticulosis in the entire examined colon.                           - Anal papilla(e) were hypertrophied.                            - The examination was otherwise normal on direct                            and retroflexion views.                           - Personal history of malignant neoplasm of the                            colon. 2014 resection Recommendation:           - Patient has a contact number available for                            emergencies. The signs and symptoms of potential                            delayed complications were discussed with the  patient. Return to normal activities tomorrow.                            Written discharge instructions were provided to the                            patient.                           - Resume previous diet.                           - Continue present medications.                           - Repeat colonoscopy is recommended for                            surveillance. The colonoscopy date will be                            determined after pathology results from today's                            exam become available for review. [name], MD 05/22/2016 8:20:07 AM This report has been signed electronically.

## 2016-05-22 NOTE — Progress Notes (Signed)
Called to room to assist during endoscopic procedure.  Patient ID and intended procedure confirmed with present staff. Received instructions for my participation in the procedure from the performing physician.  

## 2016-05-22 NOTE — Patient Instructions (Addendum)
I found and removed 1 tiny (1-2 mm) polyp. It is nothing to worry about.  You also have a condition called diverticulosis - common and not usually a problem. Please read the handout provided.  I will let you know pathology results and when to have another routine colonoscopy by mail and/or My Chart.  I appreciate the opportunity to care for you. [facility] Mayer, MD, FACG   YOU HAD AN ENDOSCOPIC PROCEDURE TODAY AT [facility]:   Refer to the procedure report that was given to you for any specific questions about what was found during the examination.  If the procedure report does not answer your questions, please call your gastroenterologist to clarify.  If you requested that your care partner not be given the details of your procedure findings, then the procedure report has been included in a sealed envelope for you to review at your convenience later.  YOU SHOULD EXPECT: Some feelings of bloating in the abdomen. Passage of more gas than usual.  Walking can help get rid of the air that was put into your GI tract during the procedure and reduce the bloating. If you had a lower endoscopy (such as a colonoscopy or flexible sigmoidoscopy) you may notice spotting of blood in your stool or on the toilet paper. If you underwent a bowel prep for your procedure, you may not have a normal bowel movement for a few days.  Please Note:  You might notice some irritation and congestion in your nose or some drainage.  This is from the oxygen used during your procedure.  There is no need for concern and it should clear up in a day or so.  SYMPTOMS TO REPORT IMMEDIATELY:   Following lower endoscopy (colonoscopy or flexible sigmoidoscopy):  Excessive amounts of blood in the stool  Significant tenderness or worsening of abdominal pains  Swelling of the abdomen that is new, acute  Fever of 100F or higher  Please read all handouts given to you by your recovery nurse.  For urgent or  emergent issues, a gastroenterologist can be reached at any hour by calling [phone].   DIET:  We do recommend a small meal at first, but then you may proceed to your regular diet.  Drink plenty of fluids but you should avoid alcoholic beverages for 24 hours.  ACTIVITY:  You should plan to take it easy for the rest of today and you should NOT DRIVE or use heavy machinery until tomorrow (because of the sedation medicines used during the test).    FOLLOW UP: Our staff will call the number listed on your records the next business day following your procedure to check on you and address any questions or concerns that you may have regarding the information given to you following your procedure. If we do not reach you, we will leave a message.  However, if you are feeling well and you are not experiencing any problems, there is no need to return our call.  We will assume that you have returned to your regular daily activities without incident.  If any biopsies were taken you will be contacted by phone or by letter within the next 1-3 weeks.  Please call us at [phone] if you have not heard about the biopsies in 3 weeks.    SIGNATURES/CONFIDENTIALITY: You and/or your care partner have signed paperwork which will be entered into your electronic medical record.  These signatures attest to the fact that that the information above  on your After Visit Summary has been reviewed and is understood.  Full responsibility of the confidentiality of this discharge information lies with you and/or your care-partner.  Thank you for letting us take care of your healthcare needs today.

## 2016-05-23 ENCOUNTER — Telehealth: Payer: Self-pay | Admitting: *Deleted

## 2016-05-23 ENCOUNTER — Telehealth: Payer: Self-pay

## 2016-05-23 NOTE — Telephone Encounter (Signed)
  Follow up Call-  Call back number 05/22/2016  Post procedure Call Back phone  # [phone] cell  Permission to leave phone message Yes  Some recent data might be hidden     Patient questions:  Do you have a fever, pain , or abdominal swelling? No. Pain Score  0 *  Have you tolerated food without any problems? Yes.    Have you been able to return to your normal activities? Yes.    Do you have any questions about your discharge instructions: Diet   No. Medications  No. Follow up visit  No.  Do you have questions or concerns about your Care? No.  Actions: * If pain score is 4 or above: No action needed, pain <4.

## 2016-05-23 NOTE — Telephone Encounter (Signed)
  Follow up Call-  Call back number 05/22/2016  Post procedure Call Back phone  # [phone] cell  Permission to leave phone message Yes  Some recent data might be hidden     Patient questions:  Do you have a fever, pain , or abdominal swelling? No. Pain Score  0 *  Have you tolerated food without any problems? Yes.    Have you been able to return to your normal activities? Yes.    Do you have any questions about your discharge instructions: Diet   No. Medications  No. Follow up visit  No.  Do you have questions or concerns about your Care? No.  Actions: * If pain score is 4 or above: No action needed, pain <4.

## 2016-05-29 ENCOUNTER — Encounter: Payer: Self-pay | Admitting: Internal Medicine

## 2016-05-29 NOTE — Progress Notes (Signed)
Polyp not pre-cancerous Colon recall 5 yrs hx colon cancer My Chart letter

## 2016-06-18 DIAGNOSIS — D1801 Hemangioma of skin and subcutaneous tissue: Secondary | ICD-10-CM | POA: Diagnosis not present

## 2016-06-18 DIAGNOSIS — D2239 Melanocytic nevi of other parts of face: Secondary | ICD-10-CM | POA: Diagnosis not present

## 2016-06-18 DIAGNOSIS — D2372 Other benign neoplasm of skin of left lower limb, including hip: Secondary | ICD-10-CM | POA: Diagnosis not present

## 2016-06-18 DIAGNOSIS — D225 Melanocytic nevi of trunk: Secondary | ICD-10-CM | POA: Diagnosis not present

## 2016-10-13 DIAGNOSIS — K219 Gastro-esophageal reflux disease without esophagitis: Secondary | ICD-10-CM | POA: Diagnosis not present

## 2016-10-13 DIAGNOSIS — C189 Malignant neoplasm of colon, unspecified: Secondary | ICD-10-CM | POA: Diagnosis not present

## 2016-10-13 DIAGNOSIS — Z23 Encounter for immunization: Secondary | ICD-10-CM | POA: Diagnosis not present

## 2016-10-13 DIAGNOSIS — I10 Essential (primary) hypertension: Secondary | ICD-10-CM | POA: Diagnosis not present

## 2016-10-13 DIAGNOSIS — E785 Hyperlipidemia, unspecified: Secondary | ICD-10-CM | POA: Diagnosis not present

## 2016-10-13 DIAGNOSIS — E119 Type 2 diabetes mellitus without complications: Secondary | ICD-10-CM | POA: Diagnosis not present

## 2017-04-15 ENCOUNTER — Inpatient Hospital Stay: Payer: BLUE CROSS/BLUE SHIELD | Attending: Oncology | Admitting: Oncology

## 2017-04-15 ENCOUNTER — Inpatient Hospital Stay: Payer: BLUE CROSS/BLUE SHIELD

## 2017-04-15 VITALS — BP 121/65 | HR 56 | Temp 98.6°F | Resp 19 | Wt 218.0 lb

## 2017-04-15 DIAGNOSIS — N83202 Unspecified ovarian cyst, left side: Secondary | ICD-10-CM | POA: Diagnosis not present

## 2017-04-15 DIAGNOSIS — I10 Essential (primary) hypertension: Secondary | ICD-10-CM | POA: Diagnosis not present

## 2017-04-15 DIAGNOSIS — Z85038 Personal history of other malignant neoplasm of large intestine: Secondary | ICD-10-CM | POA: Diagnosis not present

## 2017-04-15 DIAGNOSIS — C182 Malignant neoplasm of ascending colon: Secondary | ICD-10-CM

## 2017-04-15 LAB — CEA (IN HOUSE-CHCC): CEA (CHCC-In House): 3.33 ng/mL (ref 0.00–5.00)

## 2017-04-15 NOTE — Progress Notes (Signed)
[facility] OFFICE PROGRESS NOTE   Diagnosis: Colon cancer  INTERVAL HISTORY:   [facility] returns as scheduled.  She underwent a colonoscopy 05/22/2016.  A small polyp was removed from the transverse colon.  The pathology revealed benign colonic mucosa.  She will be scheduled for the next colonoscopy at a 5-year interval.  She feels well.  She underwent a ventral hernia repair by Dr. Ninfa Linden.  The hernia has returned.  No other complaint.   Objective:  Vital signs in last 24 hours:  There were no vitals taken for this visit.    HEENT: Neck without mass Lymphatics: No cervical, supraclavicular, axillary, or inguinal nodes.  Hidradenitis in the left axilla Resp: Lungs clear bilaterally Cardio: Regular rate and rhythm GI: No hepatosplenomegaly, no mass, nontender, small ventral hernia? Vascular: No leg edema   Lab Results:  Lab Results  Component Value Date   WBC 6.5 09/22/2012   HGB 13.2 09/22/2012   HCT 40.2 09/22/2012   MCV 96.0 09/22/2012   PLT 244 09/22/2012   NEUTROABS 3.7 09/22/2012    CMP     Component Value Date/Time   NA 141 08/05/2012 0945   K 4.2 08/05/2012 0945   CL 109 (H) 05/30/2012 1103   CO2 21 (L) 08/05/2012 0945   GLUCOSE 97 08/05/2012 0945   GLUCOSE 103 (H) 05/30/2012 1103   BUN 13.1 08/05/2012 0945   CREATININE 1.0 08/05/2012 0945   CALCIUM 9.2 08/05/2012 0945   PROT 7.2 08/05/2012 0945   ALBUMIN 3.7 08/05/2012 0945   AST 17 08/05/2012 0945   ALT 15 08/05/2012 0945   ALKPHOS 81 08/05/2012 0945   BILITOT 0.45 08/05/2012 0945   GFRNONAA 83 (L) 03/13/2012 0515   GFRAA >90 03/13/2012 0515    Lab Results  Component Value Date   CEA1 5.4 (H) 04/16/2016   CEA1 3.50 04/16/2016    Medications: I have reviewed the patient's current medications.   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 She began a first  cycle of adjuvant Xeloda on 04/23/2012. She completed cycle 7 of adjuvant Xeloda beginning on 09/03/2012.  2. Hemorrhagic left ovarian cyst.  3. History of microcytic anemia.  4. Right kidney stones noted on the preoperative CT February 2014.  5. History of diarrhea/frequent bowel movements-likely related to surgery.  6. Hot flashes and emotional swings-? Menopause. She has been seen by gynecology. The estrogen and FSH levels were not in the postmenopausal range on 06/24/2012.  7. Status post multiple skin biopsies  8. Hypertension. 9. History of hand-foot syndrome secondary to Xeloda.  10. Surveillance colonoscopy 04/08/2013. Evidence of prior ileocolonic surgical anastomosis. Mild diverticulosis noted in the transverse colon. Colon mucosa otherwise normal.   Surveillance colonoscopy 05/22/2017, benign polyp removed from transverse colon   Disposition: Tony[facility] Singh remains in clinical remission from colon cancer.  We will follow-up on the CEA from today.  She is now greater than 5 years out from diagnosis.  She plans to continue surveillance colonoscopy with Dr. Carlean Purl.  She will continue follow-up with Dr. Delena Bali for internal medicine care.  [facility] Singh is not scheduled for a follow-up appointment in the oncology clinic.  I am available to see her in the future as needed.  15 minutes were spent with the patient today.  The majority of the time was used for counseling and coordination of care.  Betsy Coder, MD  04/15/2017  8:33 AM

## 2017-04-16 ENCOUNTER — Telehealth: Payer: Self-pay

## 2017-04-16 NOTE — Telephone Encounter (Addendum)
Pt voiced understanding of message below  ----- Message from Ladell Pier, MD sent at 04/15/2017 10:29 AM EDT ----- Please call patient, CEA is normal, f/u as needed

## 2017-04-20 DIAGNOSIS — K219 Gastro-esophageal reflux disease without esophagitis: Secondary | ICD-10-CM | POA: Diagnosis not present

## 2017-04-20 DIAGNOSIS — Z1331 Encounter for screening for depression: Secondary | ICD-10-CM | POA: Diagnosis not present

## 2017-04-20 DIAGNOSIS — C189 Malignant neoplasm of colon, unspecified: Secondary | ICD-10-CM | POA: Diagnosis not present

## 2017-04-20 DIAGNOSIS — E119 Type 2 diabetes mellitus without complications: Secondary | ICD-10-CM | POA: Diagnosis not present

## 2017-04-20 DIAGNOSIS — I10 Essential (primary) hypertension: Secondary | ICD-10-CM | POA: Diagnosis not present

## 2017-04-20 DIAGNOSIS — E785 Hyperlipidemia, unspecified: Secondary | ICD-10-CM | POA: Diagnosis not present

## 2017-05-22 DIAGNOSIS — Z1231 Encounter for screening mammogram for malignant neoplasm of breast: Secondary | ICD-10-CM | POA: Diagnosis not present

## 2017-05-22 DIAGNOSIS — M85851 Other specified disorders of bone density and structure, right thigh: Secondary | ICD-10-CM | POA: Diagnosis not present

## 2017-05-22 DIAGNOSIS — M8589 Other specified disorders of bone density and structure, multiple sites: Secondary | ICD-10-CM | POA: Diagnosis not present

## 2017-06-18 DIAGNOSIS — L814 Other melanin hyperpigmentation: Secondary | ICD-10-CM | POA: Diagnosis not present

## 2017-06-18 DIAGNOSIS — D2239 Melanocytic nevi of other parts of face: Secondary | ICD-10-CM | POA: Diagnosis not present

## 2017-06-18 DIAGNOSIS — D485 Neoplasm of uncertain behavior of skin: Secondary | ICD-10-CM | POA: Diagnosis not present

## 2017-06-18 DIAGNOSIS — L821 Other seborrheic keratosis: Secondary | ICD-10-CM | POA: Diagnosis not present

## 2017-06-18 DIAGNOSIS — D225 Melanocytic nevi of trunk: Secondary | ICD-10-CM | POA: Diagnosis not present

## 2017-11-16 DIAGNOSIS — K219 Gastro-esophageal reflux disease without esophagitis: Secondary | ICD-10-CM | POA: Diagnosis not present

## 2017-11-16 DIAGNOSIS — C189 Malignant neoplasm of colon, unspecified: Secondary | ICD-10-CM | POA: Diagnosis not present

## 2017-11-16 DIAGNOSIS — E119 Type 2 diabetes mellitus without complications: Secondary | ICD-10-CM | POA: Diagnosis not present

## 2017-11-16 DIAGNOSIS — E785 Hyperlipidemia, unspecified: Secondary | ICD-10-CM | POA: Diagnosis not present

## 2017-11-16 DIAGNOSIS — Z23 Encounter for immunization: Secondary | ICD-10-CM | POA: Diagnosis not present

## 2017-11-16 DIAGNOSIS — I10 Essential (primary) hypertension: Secondary | ICD-10-CM | POA: Diagnosis not present

## 2018-02-27 DIAGNOSIS — Z713 Dietary counseling and surveillance: Secondary | ICD-10-CM | POA: Diagnosis not present

## 2018-02-27 DIAGNOSIS — E669 Obesity, unspecified: Secondary | ICD-10-CM | POA: Diagnosis not present

## 2018-03-13 DIAGNOSIS — Z713 Dietary counseling and surveillance: Secondary | ICD-10-CM | POA: Diagnosis not present

## 2018-03-13 DIAGNOSIS — E669 Obesity, unspecified: Secondary | ICD-10-CM | POA: Diagnosis not present

## 2018-03-27 DIAGNOSIS — Z713 Dietary counseling and surveillance: Secondary | ICD-10-CM | POA: Diagnosis not present

## 2018-03-27 DIAGNOSIS — E669 Obesity, unspecified: Secondary | ICD-10-CM | POA: Diagnosis not present

## 2018-04-23 DIAGNOSIS — E669 Obesity, unspecified: Secondary | ICD-10-CM | POA: Diagnosis not present

## 2018-04-23 DIAGNOSIS — Z713 Dietary counseling and surveillance: Secondary | ICD-10-CM | POA: Diagnosis not present

## 2018-05-24 DIAGNOSIS — Z9181 History of falling: Secondary | ICD-10-CM | POA: Diagnosis not present

## 2018-05-24 DIAGNOSIS — I10 Essential (primary) hypertension: Secondary | ICD-10-CM | POA: Diagnosis not present

## 2018-05-24 DIAGNOSIS — Z1331 Encounter for screening for depression: Secondary | ICD-10-CM | POA: Diagnosis not present

## 2018-05-24 DIAGNOSIS — E785 Hyperlipidemia, unspecified: Secondary | ICD-10-CM | POA: Diagnosis not present

## 2018-05-24 DIAGNOSIS — C189 Malignant neoplasm of colon, unspecified: Secondary | ICD-10-CM | POA: Diagnosis not present

## 2018-05-24 DIAGNOSIS — E1169 Type 2 diabetes mellitus with other specified complication: Secondary | ICD-10-CM | POA: Diagnosis not present

## 2018-05-24 DIAGNOSIS — K219 Gastro-esophageal reflux disease without esophagitis: Secondary | ICD-10-CM | POA: Diagnosis not present

## 2018-06-04 DIAGNOSIS — E669 Obesity, unspecified: Secondary | ICD-10-CM | POA: Diagnosis not present

## 2018-06-04 DIAGNOSIS — Z713 Dietary counseling and surveillance: Secondary | ICD-10-CM | POA: Diagnosis not present

## 2018-06-26 DIAGNOSIS — E669 Obesity, unspecified: Secondary | ICD-10-CM | POA: Diagnosis not present

## 2018-06-26 DIAGNOSIS — Z713 Dietary counseling and surveillance: Secondary | ICD-10-CM | POA: Diagnosis not present

## 2018-06-30 DIAGNOSIS — M542 Cervicalgia: Secondary | ICD-10-CM | POA: Diagnosis not present

## 2018-08-01 DIAGNOSIS — E119 Type 2 diabetes mellitus without complications: Secondary | ICD-10-CM | POA: Diagnosis not present

## 2018-08-14 DIAGNOSIS — Z713 Dietary counseling and surveillance: Secondary | ICD-10-CM | POA: Diagnosis not present

## 2018-08-14 DIAGNOSIS — E669 Obesity, unspecified: Secondary | ICD-10-CM | POA: Diagnosis not present

## 2018-11-29 DIAGNOSIS — E1169 Type 2 diabetes mellitus with other specified complication: Secondary | ICD-10-CM | POA: Diagnosis not present

## 2018-11-29 DIAGNOSIS — C189 Malignant neoplasm of colon, unspecified: Secondary | ICD-10-CM | POA: Diagnosis not present

## 2018-11-29 DIAGNOSIS — Z23 Encounter for immunization: Secondary | ICD-10-CM | POA: Diagnosis not present

## 2018-11-29 DIAGNOSIS — E785 Hyperlipidemia, unspecified: Secondary | ICD-10-CM | POA: Diagnosis not present

## 2018-11-29 DIAGNOSIS — I10 Essential (primary) hypertension: Secondary | ICD-10-CM | POA: Diagnosis not present

## 2018-11-29 DIAGNOSIS — K219 Gastro-esophageal reflux disease without esophagitis: Secondary | ICD-10-CM | POA: Diagnosis not present

## 2018-12-11 DIAGNOSIS — Z713 Dietary counseling and surveillance: Secondary | ICD-10-CM | POA: Diagnosis not present

## 2018-12-11 DIAGNOSIS — E669 Obesity, unspecified: Secondary | ICD-10-CM | POA: Diagnosis not present

## 2018-12-16 DIAGNOSIS — H524 Presbyopia: Secondary | ICD-10-CM | POA: Diagnosis not present

## 2018-12-18 DIAGNOSIS — Z1231 Encounter for screening mammogram for malignant neoplasm of breast: Secondary | ICD-10-CM | POA: Diagnosis not present

## 2019-01-22 DIAGNOSIS — Z713 Dietary counseling and surveillance: Secondary | ICD-10-CM | POA: Diagnosis not present

## 2019-01-22 DIAGNOSIS — E669 Obesity, unspecified: Secondary | ICD-10-CM | POA: Diagnosis not present

## 2019-02-26 DIAGNOSIS — Z713 Dietary counseling and surveillance: Secondary | ICD-10-CM | POA: Diagnosis not present

## 2019-02-26 DIAGNOSIS — E669 Obesity, unspecified: Secondary | ICD-10-CM | POA: Diagnosis not present

## 2019-03-26 DIAGNOSIS — E669 Obesity, unspecified: Secondary | ICD-10-CM | POA: Diagnosis not present

## 2019-03-26 DIAGNOSIS — Z713 Dietary counseling and surveillance: Secondary | ICD-10-CM | POA: Diagnosis not present

## 2019-04-23 DIAGNOSIS — Z713 Dietary counseling and surveillance: Secondary | ICD-10-CM | POA: Diagnosis not present

## 2019-04-23 DIAGNOSIS — E669 Obesity, unspecified: Secondary | ICD-10-CM | POA: Diagnosis not present

## 2019-05-30 DIAGNOSIS — C189 Malignant neoplasm of colon, unspecified: Secondary | ICD-10-CM | POA: Diagnosis not present

## 2019-05-30 DIAGNOSIS — E1169 Type 2 diabetes mellitus with other specified complication: Secondary | ICD-10-CM | POA: Diagnosis not present

## 2019-05-30 DIAGNOSIS — E785 Hyperlipidemia, unspecified: Secondary | ICD-10-CM | POA: Diagnosis not present

## 2019-11-09 DIAGNOSIS — E119 Type 2 diabetes mellitus without complications: Secondary | ICD-10-CM | POA: Diagnosis not present

## 2019-12-12 DIAGNOSIS — E1169 Type 2 diabetes mellitus with other specified complication: Secondary | ICD-10-CM | POA: Diagnosis not present

## 2019-12-12 DIAGNOSIS — Z85038 Personal history of other malignant neoplasm of large intestine: Secondary | ICD-10-CM | POA: Diagnosis not present

## 2019-12-12 DIAGNOSIS — I10 Essential (primary) hypertension: Secondary | ICD-10-CM | POA: Diagnosis not present

## 2019-12-12 DIAGNOSIS — K219 Gastro-esophageal reflux disease without esophagitis: Secondary | ICD-10-CM | POA: Diagnosis not present

## 2019-12-12 DIAGNOSIS — E785 Hyperlipidemia, unspecified: Secondary | ICD-10-CM | POA: Diagnosis not present

## 2019-12-12 DIAGNOSIS — Z23 Encounter for immunization: Secondary | ICD-10-CM | POA: Diagnosis not present

## 2020-01-11 DIAGNOSIS — Z1231 Encounter for screening mammogram for malignant neoplasm of breast: Secondary | ICD-10-CM | POA: Diagnosis not present

## 2020-06-07 DIAGNOSIS — H9201 Otalgia, right ear: Secondary | ICD-10-CM | POA: Diagnosis not present

## 2020-06-07 DIAGNOSIS — H6121 Impacted cerumen, right ear: Secondary | ICD-10-CM | POA: Diagnosis not present

## 2020-06-11 DIAGNOSIS — Z1331 Encounter for screening for depression: Secondary | ICD-10-CM | POA: Diagnosis not present

## 2020-06-11 DIAGNOSIS — Z85038 Personal history of other malignant neoplasm of large intestine: Secondary | ICD-10-CM | POA: Diagnosis not present

## 2020-06-11 DIAGNOSIS — K219 Gastro-esophageal reflux disease without esophagitis: Secondary | ICD-10-CM | POA: Diagnosis not present

## 2020-06-11 DIAGNOSIS — I10 Essential (primary) hypertension: Secondary | ICD-10-CM | POA: Diagnosis not present

## 2020-06-11 DIAGNOSIS — E785 Hyperlipidemia, unspecified: Secondary | ICD-10-CM | POA: Diagnosis not present

## 2020-06-11 DIAGNOSIS — E1169 Type 2 diabetes mellitus with other specified complication: Secondary | ICD-10-CM | POA: Diagnosis not present

## 2020-12-10 DIAGNOSIS — K219 Gastro-esophageal reflux disease without esophagitis: Secondary | ICD-10-CM | POA: Diagnosis not present

## 2020-12-10 DIAGNOSIS — E1169 Type 2 diabetes mellitus with other specified complication: Secondary | ICD-10-CM | POA: Diagnosis not present

## 2020-12-10 DIAGNOSIS — Z85038 Personal history of other malignant neoplasm of large intestine: Secondary | ICD-10-CM | POA: Diagnosis not present

## 2020-12-10 DIAGNOSIS — E785 Hyperlipidemia, unspecified: Secondary | ICD-10-CM | POA: Diagnosis not present

## 2020-12-10 DIAGNOSIS — I10 Essential (primary) hypertension: Secondary | ICD-10-CM | POA: Diagnosis not present

## 2020-12-10 DIAGNOSIS — Z23 Encounter for immunization: Secondary | ICD-10-CM | POA: Diagnosis not present

## 2021-01-16 DIAGNOSIS — Z1231 Encounter for screening mammogram for malignant neoplasm of breast: Secondary | ICD-10-CM | POA: Diagnosis not present

## 2021-02-20 DIAGNOSIS — Z20828 Contact with and (suspected) exposure to other viral communicable diseases: Secondary | ICD-10-CM | POA: Diagnosis not present

## 2021-02-20 DIAGNOSIS — J029 Acute pharyngitis, unspecified: Secondary | ICD-10-CM | POA: Diagnosis not present

## 2021-04-05 DIAGNOSIS — E119 Type 2 diabetes mellitus without complications: Secondary | ICD-10-CM | POA: Diagnosis not present

## 2021-04-05 DIAGNOSIS — H524 Presbyopia: Secondary | ICD-10-CM | POA: Diagnosis not present

## 2021-06-10 DIAGNOSIS — E1169 Type 2 diabetes mellitus with other specified complication: Secondary | ICD-10-CM | POA: Diagnosis not present

## 2021-06-10 DIAGNOSIS — K219 Gastro-esophageal reflux disease without esophagitis: Secondary | ICD-10-CM | POA: Diagnosis not present

## 2021-06-10 DIAGNOSIS — E785 Hyperlipidemia, unspecified: Secondary | ICD-10-CM | POA: Diagnosis not present

## 2021-06-10 DIAGNOSIS — Z85038 Personal history of other malignant neoplasm of large intestine: Secondary | ICD-10-CM | POA: Diagnosis not present

## 2021-06-10 DIAGNOSIS — I10 Essential (primary) hypertension: Secondary | ICD-10-CM | POA: Diagnosis not present

## 2021-07-17 ENCOUNTER — Encounter: Payer: Self-pay | Admitting: Internal Medicine

## 2021-08-02 ENCOUNTER — Encounter: Payer: Self-pay | Admitting: Internal Medicine

## 2021-09-04 ENCOUNTER — Ambulatory Visit (AMBULATORY_SURGERY_CENTER): Payer: BLUE CROSS/BLUE SHIELD | Admitting: *Deleted

## 2021-09-04 VITALS — Ht 67.5 in | Wt 238.0 lb

## 2021-09-04 DIAGNOSIS — Z85038 Personal history of other malignant neoplasm of large intestine: Secondary | ICD-10-CM

## 2021-09-04 MED ORDER — CLENPIQ 10-3.5-12 MG-GM -GM/175ML PO SOLN: 1.0000 | ORAL | 0 refills | Status: DC

## 2021-09-04 NOTE — Progress Notes (Signed)
Patient is here in-person for PV. Patient denies any allergies to eggs or soy. Patient denies any problems with anesthesia/sedation. Patient is not on any oxygen at home. Patient is not taking any diet/weight loss medications or blood thinners. Went over procedure prep instructions with the patient. Patient is aware of our care-partner policy. Patient notified to use [facility] for prescription.   Patient denies constipation. Patient request different prep-she did not tolerate the Miralax/Gatorade prep last time. Offered our preps to pt she request Clenpiq. Pt aware of price. Pt denies any kidney disease.

## 2021-09-08 ENCOUNTER — Encounter: Payer: Self-pay | Admitting: Internal Medicine

## 2021-09-17 ENCOUNTER — Encounter: Payer: Self-pay | Admitting: Certified Registered Nurse Anesthetist

## 2021-09-18 ENCOUNTER — Telehealth: Payer: Self-pay | Admitting: Internal Medicine

## 2021-09-18 DIAGNOSIS — Z85038 Personal history of other malignant neoplasm of large intestine: Secondary | ICD-10-CM

## 2021-09-18 MED ORDER — NA SULFATE-K SULFATE-MG SULF 17.5-3.13-1.6 GM/177ML PO SOLN: 1.0000 | ORAL | 0 refills | Status: DC

## 2021-09-18 NOTE — Telephone Encounter (Signed)
Spoke with pt. Changes prep to Suprep. New RX sent to [facility] and patient will use [facility]. New suprep instructions sent to pt's MyChart. Pt is aware-she will call us back with any questions.

## 2021-09-18 NOTE — Telephone Encounter (Signed)
Inbound call from patient stating that her insurance will not cover Clenpiq and is requesting a different prep be sent in for her. Patient is scheduled to have procedure on  9/18 at 8:00. Please advise.

## 2021-09-24 NOTE — Progress Notes (Unsigned)
[facility] History and Physical   Primary Care Physician:  Nicoletta Dress, MD   Reason for Procedure:   Personal hx colon cancer and polyps  Plan:    colonoscopy     HPI: [facility] is a 56 y.o. female w/ hx of colon cancer and right hemi-colectomy 2014. Last colonoscopy 2018, no neoplasia.   Past Medical History:  Diagnosis Date   Allergy    Anemia    when had colon cancer   Cancer ([place])    COLON CANCER, skin cancer   GERD (gastroesophageal reflux disease)    Hernia, inguinal, left    Hypertension    Kidney stones 2011   PT STATES SHE STILL HAS A STONE   Prediabetes     Past Surgical History:  Procedure Laterality Date   ABDOMINAL HYSTERECTOMY     APPENDECTOMY     CESAREAN SECTION     COLON SURGERY     COLONOSCOPY     COLONOSCOPY WITH PROPOFOL  05/22/2016   Dr.Lelend Heinecke   CYSTOSCOPY WITH RETROGRADE PYELOGRAM, URETEROSCOPY AND STENT PLACEMENT     INGUINAL HERNIA REPAIR     left side   LAPAROSCOPIC PARTIAL COLECTOMY N/A 03/12/2012   Procedure: LAPAROSCOPIC Assisted PARTIAL COLECTOMY;  Surgeon: Harl Bowie, MD;  Location: [facility];  Service: General;  Laterality: N/A;   LITHOTRIPSY     PARTIAL HYSTERECTOMY  2007   UPPER GASTROINTESTINAL ENDOSCOPY     WISDOM TOOTH EXTRACTION      Prior to Admission medications   Medication Sig Start Date End Date Taking? Authorizing Provider  Na Sulfate-K Sulfate-Mg Sulf 17.5-3.13-1.6 GM/177ML SOLN Take 1 kit by mouth as directed. May use generic SUPREP;NO prior authorizations will be done.Please use Singlecare or GOOD-RX coupon. 09/18/21   [name], MD  pantoprazole (PROTONIX) 40 MG tablet TAKE 1 TABLET (40 MG TOTAL) BY MOUTH DAILY BEFORE BREAKFAST. 10/25/14   [name], MD  valsartan-hydrochlorothiazide (DIOVAN-HCT) 320-25 MG tablet Take 1 tablet by mouth daily.    [provider]    Current Outpatient Medications  Medication Sig Dispense Refill   pantoprazole (PROTONIX) 40 MG  tablet TAKE 1 TABLET (40 MG TOTAL) BY MOUTH DAILY BEFORE BREAKFAST. 30 tablet 3   valsartan-hydrochlorothiazide (DIOVAN-HCT) 320-25 MG tablet Take 1 tablet by mouth daily.     Current Facility-Administered Medications  Medication Dose Route Frequency Provider Last Rate Last Admin   0.9 %  sodium chloride infusion  500 mL Intravenous Once [name], MD        Allergies as of 09/25/2021 - Review Complete 09/25/2021  Allergen Reaction Noted   Ace inhibitors Cough 10/15/2014   Penicillins Hives and Rash 12/31/2011    Family History  Problem Relation Age of Onset   Colon polyps Father    Breast cancer Maternal Grandmother    Colon cancer Maternal Aunt    Prostate cancer Maternal Uncle    Esophageal cancer Neg Hx    Pancreatic cancer Neg Hx    Rectal cancer Neg Hx    Stomach cancer Neg Hx     Social History   Socioeconomic History   Marital status: Widowed    Spouse name: Not on file   Number of children: 1   Years of education: Not on file   Highest education level: Not on file  Occupational History   Occupation: unemployed  Tobacco Use   Smoking status: Former    Packs/day: 0.50    Years: 25.00  Total pack years: 12.50    Types: Cigarettes   Smokeless tobacco: Never   Tobacco comments:    QUIT SMOKING ABOUT 2009  Vaping Use   Vaping Use: Never used  Substance and Sexual Activity   Alcohol use: Yes    Alcohol/week: 2.0 standard drinks of alcohol    Types: 2 Standard drinks or equivalent per week   Drug use: No   Sexual activity: Yes    Birth control/protection: None  Other Topics Concern   Not on file  Social History Narrative   Laid off mental health qualified professional   Married, one son (adult)--in college at [employer] Strain: Not on file  Food Insecurity: Not on file  Transportation Needs: Not on file  Physical Activity: Not on file  Stress: Not on file  Social  Connections: Not on file  Intimate Partner Violence: Not on file    Review of Systems:  All other review of systems negative except as mentioned in the HPI.  Physical Exam: Vital signs BP (!) 152/90   Pulse (!) 58   Temp (!) 96.2 F (35.7 C)   Ht 5' 7.5" (1.715 m)   Wt 238 lb (108 kg)   SpO2 96%   BMI 36.73 kg/m   General:   Alert,  Well-developed, well-nourished, pleasant and cooperative in NAD Lungs:  Clear throughout to auscultation.   Heart:  Regular rate and rhythm; no murmurs, clicks, rubs,  or gallops. Abdomen:  Soft, nontender and nondistended. Normal bowel sounds.   Neuro/Psych:  Alert and cooperative. Normal mood and affect. A and O x 3   @Shirlean Berman  Simonne Maffucci, MD, [facility] [phone] (pager) 09/25/2021 7:54 AM@

## 2021-09-25 ENCOUNTER — Encounter: Payer: Self-pay | Admitting: Internal Medicine

## 2021-09-25 ENCOUNTER — Ambulatory Visit (AMBULATORY_SURGERY_CENTER): Payer: BC Managed Care – PPO | Admitting: Internal Medicine

## 2021-09-25 VITALS — BP 121/73 | HR 52 | Temp 96.2°F | Resp 14 | Ht 67.5 in | Wt 238.0 lb

## 2021-09-25 DIAGNOSIS — Z1211 Encounter for screening for malignant neoplasm of colon: Secondary | ICD-10-CM | POA: Diagnosis not present

## 2021-09-25 DIAGNOSIS — D123 Benign neoplasm of transverse colon: Secondary | ICD-10-CM

## 2021-09-25 DIAGNOSIS — Z85038 Personal history of other malignant neoplasm of large intestine: Secondary | ICD-10-CM | POA: Diagnosis not present

## 2021-09-25 DIAGNOSIS — Z08 Encounter for follow-up examination after completed treatment for malignant neoplasm: Secondary | ICD-10-CM | POA: Diagnosis not present

## 2021-09-25 DIAGNOSIS — Z86010 Personal history of colonic polyps: Secondary | ICD-10-CM

## 2021-09-25 MED ORDER — SODIUM CHLORIDE 0.9 % IV SOLN: 500.0000 mL | Freq: Once | INTRAVENOUS | Status: DC

## 2021-09-25 NOTE — Patient Instructions (Addendum)
There was a tiny benign-appearing polyp removed today. I think it will be 5 years to repeat an exam.  You also have a condition called diverticulosis - common and not usually a problem. Please read the handout provided.   I will let you know pathology results and when to have another routine colonoscopy by mail and/or My Chart.  I appreciate the opportunity to care for you. [name], MD, [facility]  Please read handouts provided. Continue present medications. Await pathology results.   YOU HAD AN ENDOSCOPIC PROCEDURE TODAY AT [facility]:   Refer to the procedure report that was given to you for any specific questions about what was found during the examination.  If the procedure report does not answer your questions, please call your gastroenterologist to clarify.  If you requested that your care partner not be given the details of your procedure findings, then the procedure report has been included in a sealed envelope for you to review at your convenience later.  YOU SHOULD EXPECT: Some feelings of bloating in the abdomen. Passage of more gas than usual.  Walking can help get rid of the air that was put into your GI tract during the procedure and reduce the bloating. If you had a lower endoscopy (such as a colonoscopy or flexible sigmoidoscopy) you may notice spotting of blood in your stool or on the toilet paper. If you underwent a bowel prep for your procedure, you may not have a normal bowel movement for a few days.  Please Note:  You might notice some irritation and congestion in your nose or some drainage.  This is from the oxygen used during your procedure.  There is no need for concern and it should clear up in a day or so.  SYMPTOMS TO REPORT IMMEDIATELY:  Following lower endoscopy (colonoscopy or flexible sigmoidoscopy):  Excessive amounts of blood in the stool  Significant tenderness or worsening of abdominal pains  Swelling of the abdomen that is new,  acute  Fever of 100F or higher.  For urgent or emergent issues, a gastroenterologist can be reached at any hour by calling [phone]. Do not use MyChart messaging for urgent concerns.    DIET:  We do recommend a small meal at first, but then you may proceed to your regular diet.  Drink plenty of fluids but you should avoid alcoholic beverages for 24 hours.  ACTIVITY:  You should plan to take it easy for the rest of today and you should NOT DRIVE or use heavy machinery until tomorrow (because of the sedation medicines used during the test).    FOLLOW UP: Our staff will call the number listed on your records the next business day following your procedure.  We will call around 7:15- 8:00 am to check on you and address any questions or concerns that you may have regarding the information given to you following your procedure. If we do not reach you, we will leave a message.     If any biopsies were taken you will be contacted by phone or by letter within the next 1-3 weeks.  Please call us at [phone] if you have not heard about the biopsies in 3 weeks.    SIGNATURES/CONFIDENTIALITY: You and/or your care partner have signed paperwork which will be entered into your electronic medical record.  These signatures attest to the fact that that the information above on your After Visit Summary has been reviewed and is understood.  Full responsibility of  the confidentiality of this discharge information lies with you and/or your care-partner.

## 2021-09-25 NOTE — Progress Notes (Signed)
Called to room to assist during endoscopic procedure.  Patient ID and intended procedure confirmed with present staff. Received instructions for my participation in the procedure from the performing physician.  

## 2021-09-25 NOTE — Op Note (Signed)
[facility] Patient Name: Tonya Singh Procedure Date: 09/25/2021 7:55 AM MRN: [id number] Endoscopist: [name] , MD Age: 56 Referring MD:  Date of Birth: October 19, 1965 Gender: Female Account #: [id number] Procedure:                Colonoscopy Indications:              High risk colon cancer surveillance: Personal                            history of colon cancer, Last colonoscopy: 2018 Medicines:                Monitored Anesthesia Care Procedure:                Pre-Anesthesia Assessment:                           - Prior to the procedure, a History and Physical                            was performed, and patient medications and                            allergies were reviewed. The patient's tolerance of                            previous anesthesia was also reviewed. The risks                            and benefits of the procedure and the sedation                            options and risks were discussed with the patient.                            All questions were answered, and informed consent                            was obtained. Prior Anticoagulants: The patient has                            taken no previous anticoagulant or antiplatelet                            agents. ASA Grade Assessment: II - A patient with                            mild systemic disease. After reviewing the risks                            and benefits, the patient was deemed in                            satisfactory condition to undergo the procedure.  After obtaining informed consent, the colonoscope                            was passed under direct vision. Throughout the                            procedure, the patient's blood pressure, pulse, and                            oxygen saturations were monitored continuously. The                            CF [id number] #[id number] was introduced through the anus                            and advanced  to the the ileocolonic anastomosis.                            The colonoscopy was performed without difficulty.                            The patient tolerated the procedure well. The                            quality of the bowel preparation was good. The                            rectum and Ileocolonic anastomsis areas were                            photographed. Scope In: 8:01:41 AM Scope Out: 8:11:38 AM Scope Withdrawal Time: 0 hours 7 minutes 23 seconds  Total Procedure Duration: 0 hours 9 minutes 57 seconds  Findings:                 Skin tags were found on perianal exam.                           The digital rectal exam was normal.                           A 3 mm polyp was found in the transverse colon. The                            polyp was sessile. The polyp was removed with a                            cold snare. Resection and retrieval were complete.                            Verification of patient identification for the                            specimen was done. Estimated blood loss was minimal.  Multiple diverticula were found in the sigmoid                            colon.                           The exam was otherwise without abnormality on                            direct and retroflexion views. Complications:            No immediate complications. Estimated Blood Loss:     Estimated blood loss was minimal. Impression:               - Perianal skin tags found on perianal exam.                           - One 3 mm polyp in the transverse colon, removed                            with a cold snare. Resected and retrieved.                           - Diverticulosis in the sigmoid colon.                           - The examination was otherwise normal on direct                            and retroflexion views.                           - Personal history of colonic polyp adenoma 2015.                           - Personal history of  malignant neoplasm of the                            colon. resevted 2014 Recommendation:           - Patient has a contact number available for                            emergencies. The signs and symptoms of potential                            delayed complications were discussed with the                            patient. Return to normal activities tomorrow.                            Written discharge instructions were provided to the                            patient.                           -  Resume previous diet.                           - Continue present medications.                           - Await pathology results.                           - Repeat colonoscopy is recommended for                            surveillance. The colonoscopy date will be                            determined after pathology results from today's                            exam become available for review. [name], MD 09/25/2021 8:19:55 AM This report has been signed electronically.

## 2021-09-25 NOTE — Progress Notes (Signed)
Report given to PACU, vss 

## 2021-09-25 NOTE — Progress Notes (Signed)
Pt's states no medical or surgical changes since previsit or office visit. 

## 2021-09-26 ENCOUNTER — Telehealth: Payer: Self-pay

## 2021-09-26 NOTE — Telephone Encounter (Signed)
  Follow up Call-     09/25/2021    7:13 AM  Call back number  Post procedure Call Back phone  # [phone]  Permission to leave phone message Yes     Patient questions:  Do you have a fever, pain , or abdominal swelling? No. Pain Score  0 *  Have you tolerated food without any problems? Yes.    Have you been able to return to your normal activities? Yes.    Do you have any questions about your discharge instructions: Diet   No. Medications  No. Follow up visit  No.  Do you have questions or concerns about your Care? No.  Actions: * If pain score is 4 or above: No action needed, pain <4.

## 2021-10-15 ENCOUNTER — Encounter: Payer: Self-pay | Admitting: Internal Medicine

## 2021-12-16 DIAGNOSIS — E785 Hyperlipidemia, unspecified: Secondary | ICD-10-CM | POA: Diagnosis not present

## 2021-12-16 DIAGNOSIS — Z23 Encounter for immunization: Secondary | ICD-10-CM | POA: Diagnosis not present

## 2021-12-16 DIAGNOSIS — Z85038 Personal history of other malignant neoplasm of large intestine: Secondary | ICD-10-CM | POA: Diagnosis not present

## 2021-12-16 DIAGNOSIS — E1169 Type 2 diabetes mellitus with other specified complication: Secondary | ICD-10-CM | POA: Diagnosis not present

## 2021-12-16 DIAGNOSIS — K219 Gastro-esophageal reflux disease without esophagitis: Secondary | ICD-10-CM | POA: Diagnosis not present

## 2021-12-16 DIAGNOSIS — I10 Essential (primary) hypertension: Secondary | ICD-10-CM | POA: Diagnosis not present

## 2022-01-09 DIAGNOSIS — L02511 Cutaneous abscess of right hand: Secondary | ICD-10-CM | POA: Diagnosis not present

## 2022-01-22 DIAGNOSIS — Z1231 Encounter for screening mammogram for malignant neoplasm of breast: Secondary | ICD-10-CM | POA: Diagnosis not present

## 2022-04-27 DIAGNOSIS — H524 Presbyopia: Secondary | ICD-10-CM | POA: Diagnosis not present

## 2022-04-27 DIAGNOSIS — E119 Type 2 diabetes mellitus without complications: Secondary | ICD-10-CM | POA: Diagnosis not present

## 2022-06-30 DIAGNOSIS — K219 Gastro-esophageal reflux disease without esophagitis: Secondary | ICD-10-CM | POA: Diagnosis not present

## 2022-06-30 DIAGNOSIS — I10 Essential (primary) hypertension: Secondary | ICD-10-CM | POA: Diagnosis not present

## 2022-06-30 DIAGNOSIS — E1169 Type 2 diabetes mellitus with other specified complication: Secondary | ICD-10-CM | POA: Diagnosis not present

## 2022-06-30 DIAGNOSIS — E785 Hyperlipidemia, unspecified: Secondary | ICD-10-CM | POA: Diagnosis not present

## 2022-11-30 DIAGNOSIS — L739 Follicular disorder, unspecified: Secondary | ICD-10-CM | POA: Diagnosis not present

## 2022-11-30 DIAGNOSIS — L821 Other seborrheic keratosis: Secondary | ICD-10-CM | POA: Diagnosis not present

## 2022-11-30 DIAGNOSIS — L814 Other melanin hyperpigmentation: Secondary | ICD-10-CM | POA: Diagnosis not present

## 2022-11-30 DIAGNOSIS — D485 Neoplasm of uncertain behavior of skin: Secondary | ICD-10-CM | POA: Diagnosis not present

## 2022-11-30 DIAGNOSIS — D225 Melanocytic nevi of trunk: Secondary | ICD-10-CM | POA: Diagnosis not present

## 2022-12-22 DIAGNOSIS — E1169 Type 2 diabetes mellitus with other specified complication: Secondary | ICD-10-CM | POA: Diagnosis not present

## 2022-12-22 DIAGNOSIS — I10 Essential (primary) hypertension: Secondary | ICD-10-CM | POA: Diagnosis not present

## 2022-12-22 DIAGNOSIS — E785 Hyperlipidemia, unspecified: Secondary | ICD-10-CM | POA: Diagnosis not present

## 2022-12-22 DIAGNOSIS — K219 Gastro-esophageal reflux disease without esophagitis: Secondary | ICD-10-CM | POA: Diagnosis not present

## 2022-12-22 DIAGNOSIS — Z23 Encounter for immunization: Secondary | ICD-10-CM | POA: Diagnosis not present

## 2022-12-22 DIAGNOSIS — Z1331 Encounter for screening for depression: Secondary | ICD-10-CM | POA: Diagnosis not present

## 2022-12-28 DIAGNOSIS — C44622 Squamous cell carcinoma of skin of right upper limb, including shoulder: Secondary | ICD-10-CM | POA: Diagnosis not present

## 2023-02-06 DIAGNOSIS — J208 Acute bronchitis due to other specified organisms: Secondary | ICD-10-CM | POA: Diagnosis not present

## 2023-04-03 DIAGNOSIS — Z1231 Encounter for screening mammogram for malignant neoplasm of breast: Secondary | ICD-10-CM | POA: Diagnosis not present

## 2023-05-03 DIAGNOSIS — H524 Presbyopia: Secondary | ICD-10-CM | POA: Diagnosis not present

## 2023-05-03 DIAGNOSIS — E119 Type 2 diabetes mellitus without complications: Secondary | ICD-10-CM | POA: Diagnosis not present

## 2023-06-21 DIAGNOSIS — K219 Gastro-esophageal reflux disease without esophagitis: Secondary | ICD-10-CM | POA: Diagnosis not present

## 2023-06-21 DIAGNOSIS — I10 Essential (primary) hypertension: Secondary | ICD-10-CM | POA: Diagnosis not present

## 2023-06-21 DIAGNOSIS — E1169 Type 2 diabetes mellitus with other specified complication: Secondary | ICD-10-CM | POA: Diagnosis not present

## 2023-06-21 DIAGNOSIS — E785 Hyperlipidemia, unspecified: Secondary | ICD-10-CM | POA: Diagnosis not present

## 2023-11-04 DIAGNOSIS — Z23 Encounter for immunization: Secondary | ICD-10-CM | POA: Diagnosis not present

## 2023-11-04 DIAGNOSIS — M109 Gout, unspecified: Secondary | ICD-10-CM | POA: Diagnosis not present

## 2023-12-13 DIAGNOSIS — D225 Melanocytic nevi of trunk: Secondary | ICD-10-CM | POA: Diagnosis not present

## 2023-12-13 DIAGNOSIS — B351 Tinea unguium: Secondary | ICD-10-CM | POA: Diagnosis not present

## 2023-12-13 DIAGNOSIS — D485 Neoplasm of uncertain behavior of skin: Secondary | ICD-10-CM | POA: Diagnosis not present

## 2023-12-13 DIAGNOSIS — L814 Other melanin hyperpigmentation: Secondary | ICD-10-CM | POA: Diagnosis not present

## 2023-12-13 DIAGNOSIS — L821 Other seborrheic keratosis: Secondary | ICD-10-CM | POA: Diagnosis not present

## 2023-12-27 DIAGNOSIS — E1169 Type 2 diabetes mellitus with other specified complication: Secondary | ICD-10-CM | POA: Diagnosis not present

## 2023-12-27 DIAGNOSIS — I10 Essential (primary) hypertension: Secondary | ICD-10-CM | POA: Diagnosis not present

## 2023-12-27 DIAGNOSIS — M109 Gout, unspecified: Secondary | ICD-10-CM | POA: Diagnosis not present

## 2023-12-27 DIAGNOSIS — E785 Hyperlipidemia, unspecified: Secondary | ICD-10-CM | POA: Diagnosis not present
